# Patient Record
Sex: Female | Born: 1975 | Race: White | Hispanic: No | Marital: Married | Smoking: Current every day smoker
Health system: Southern US, Community
[De-identification: ages and names within clinical notes are randomized; demographics above are authoritative.]

## PROBLEM LIST (undated history)

## (undated) DIAGNOSIS — F32A Depression, unspecified: Secondary | ICD-10-CM

## (undated) DIAGNOSIS — F329 Major depressive disorder, single episode, unspecified: Secondary | ICD-10-CM

## (undated) DIAGNOSIS — Z87442 Personal history of urinary calculi: Secondary | ICD-10-CM

## (undated) DIAGNOSIS — K219 Gastro-esophageal reflux disease without esophagitis: Secondary | ICD-10-CM

## (undated) DIAGNOSIS — D259 Leiomyoma of uterus, unspecified: Secondary | ICD-10-CM

## (undated) DIAGNOSIS — N200 Calculus of kidney: Secondary | ICD-10-CM

## (undated) DIAGNOSIS — F419 Anxiety disorder, unspecified: Secondary | ICD-10-CM

## (undated) DIAGNOSIS — E039 Hypothyroidism, unspecified: Secondary | ICD-10-CM

## (undated) HISTORY — DX: Calculus of kidney: N20.0

---

## 2004-10-28 ENCOUNTER — Ambulatory Visit: Payer: Self-pay

## 2004-11-26 ENCOUNTER — Observation Stay: Payer: Self-pay | Admitting: Obstetrics and Gynecology

## 2005-07-28 ENCOUNTER — Ambulatory Visit: Payer: Self-pay

## 2005-08-10 ENCOUNTER — Ambulatory Visit: Payer: Self-pay | Admitting: Urology

## 2006-08-03 ENCOUNTER — Ambulatory Visit: Payer: Self-pay | Admitting: Family Medicine

## 2007-12-02 ENCOUNTER — Emergency Department: Payer: Self-pay | Admitting: Emergency Medicine

## 2007-12-04 ENCOUNTER — Ambulatory Visit: Payer: Self-pay | Admitting: Urology

## 2007-12-10 ENCOUNTER — Ambulatory Visit: Payer: Self-pay | Admitting: Urology

## 2007-12-12 ENCOUNTER — Ambulatory Visit: Payer: Self-pay | Admitting: Urology

## 2007-12-13 ENCOUNTER — Ambulatory Visit: Payer: Self-pay | Admitting: Urology

## 2008-03-24 ENCOUNTER — Ambulatory Visit: Payer: Self-pay | Admitting: Urology

## 2008-10-31 ENCOUNTER — Ambulatory Visit: Payer: Self-pay | Admitting: Urology

## 2009-02-28 IMAGING — CR DG ABDOMEN 1V
1 series · 1 of 1 positions shown · non-contrast
Comparison: none

REASON FOR EXAM: renal calculi-lithotripsy
COMMENTS:

[view not recorded]
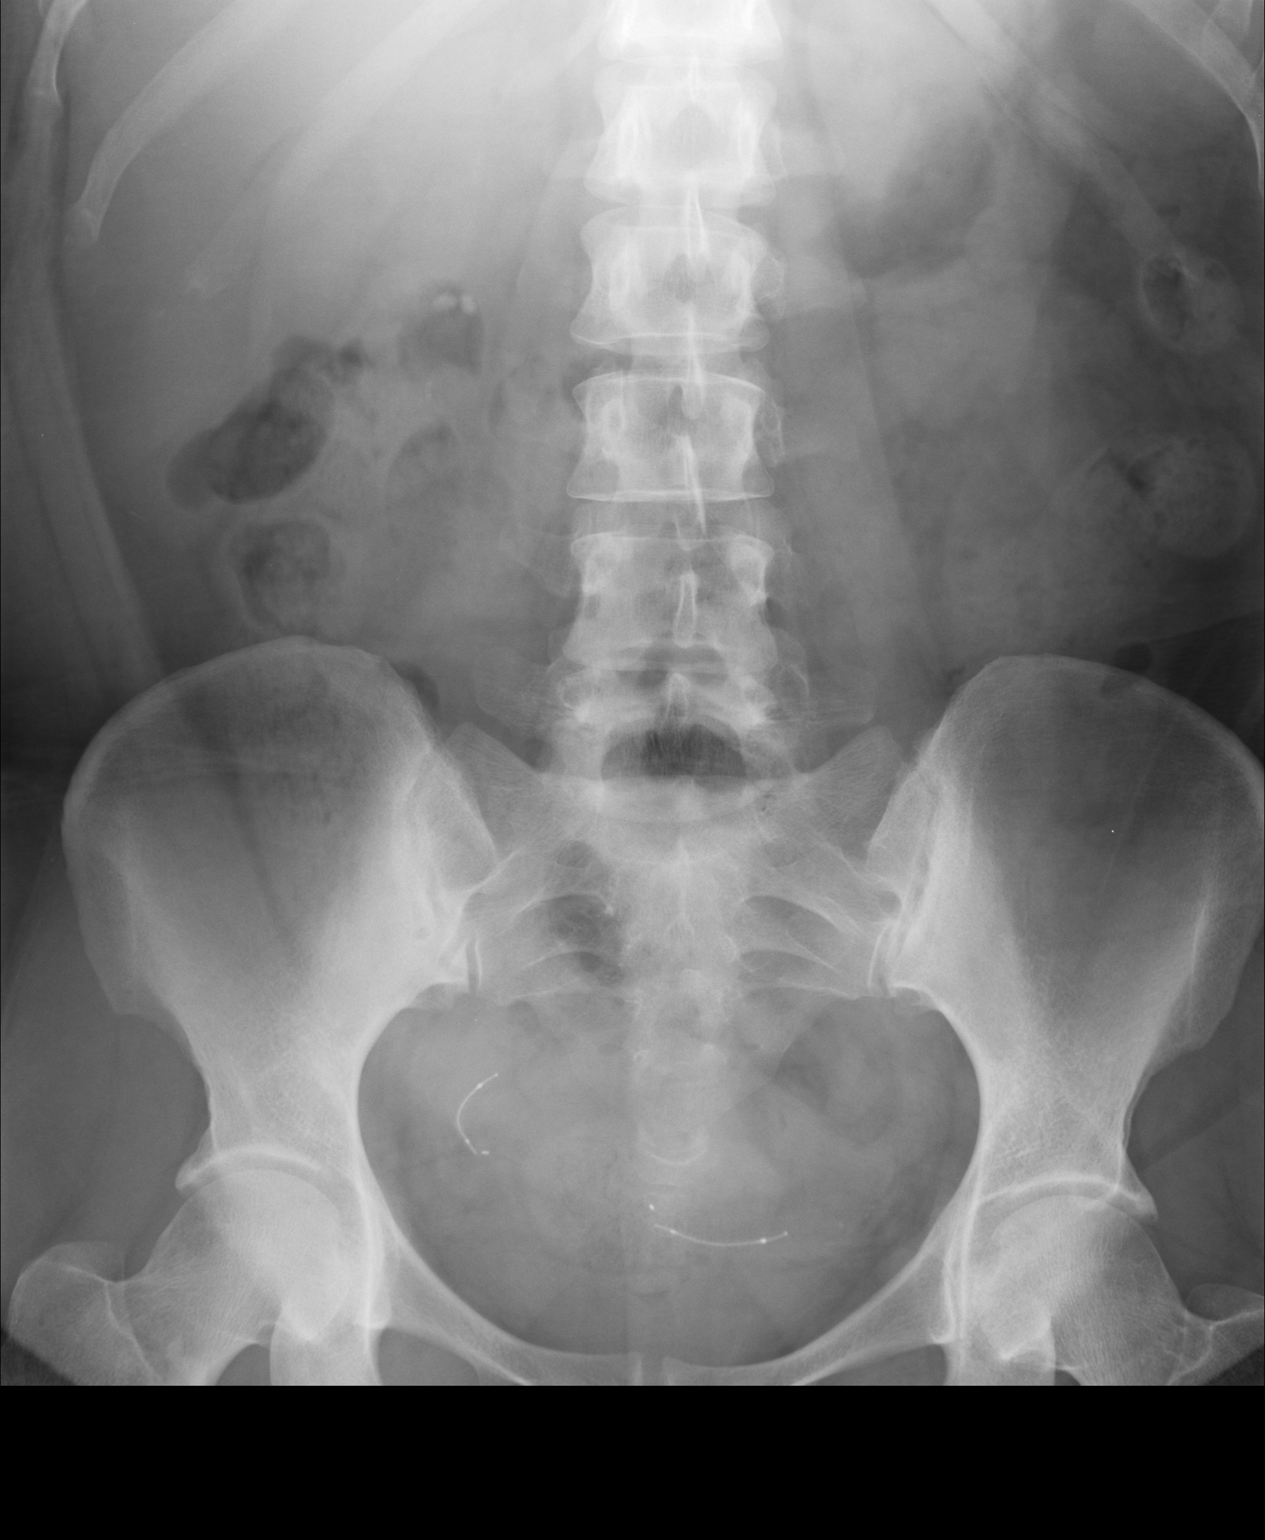

[1 of 1 positions shown; findings below may reference images not displayed]

PROCEDURE:     DXR - DXR KIDNEY URETER BLADDER  - December 13, 2007 [DATE]

RESULT:     Comparison is made to the prior exam of 12/10/2007. There are two
calcifications or calcification fragments projected over the midpole region
of the RIGHT kidney medially and which could be in the renal pelvis. No
additional renal or ureteral calcifications are seen.
IMPRESSION: 1.     RIGHT nephrolithiasis.

## 2009-06-12 ENCOUNTER — Emergency Department: Payer: Self-pay | Admitting: Emergency Medicine

## 2009-12-16 ENCOUNTER — Emergency Department: Payer: Self-pay | Admitting: Emergency Medicine

## 2012-08-14 ENCOUNTER — Observation Stay: Payer: Self-pay | Admitting: Specialist

## 2012-08-14 LAB — BASIC METABOLIC PANEL
Calcium, Total: 9.3 mg/dL (ref 8.5–10.1)
Chloride: 108 mmol/L — ABNORMAL HIGH (ref 98–107)
Creatinine: 0.79 mg/dL (ref 0.60–1.30)
EGFR (African American): >60
Potassium: 3.4 mmol/L — ABNORMAL LOW (ref 3.5–5.1)
Sodium: 140 mmol/L (ref 136–145)

## 2012-08-14 LAB — CBC WITH DIFFERENTIAL/PLATELET
Basophil #: 0 10*3/uL (ref 0.0–0.1)
Basophil %: 0.1 %
Eosinophil #: 0 10*3/uL (ref 0.0–0.7)
HCT: 39.7 % (ref 35.0–47.0)
MCHC: 34.5 g/dL (ref 32.0–36.0)
Monocyte #: 0.4 x10 3/mm (ref 0.2–0.9)
Neutrophil #: 14 10*3/uL — ABNORMAL HIGH (ref 1.4–6.5)
RBC: 4.28 10*6/uL (ref 3.80–5.20)
RDW: 13.7 % (ref 11.5–14.5)
WBC: 15.6 10*3/uL — ABNORMAL HIGH (ref 3.6–11.0)

## 2014-10-17 NOTE — H&P (Signed)
PATIENT NAME:  Jennifer [name], Jennifer [name] MR#:  [id number] DATE OF BIRTH:  [birth year]  DATE OF ADMISSION:  08/14/2012  PRIMARY CARE PHYSICIAN: Delight Stare, MD  REFERRING PHYSICIAN: Conni Slipper, MD  CHIEF COMPLAINT: Lip swelling, hives and raspy voice.  HISTORY OF PRESENT ILLNESS: The patient is a 39 year old Caucasian female with a past medical history of allergic rhinitis, depression and anxiety who is presenting to the ER with the  chief complaint of swollen lips, hives and raspy voice. The patient is reporting that she has been experiencing cold for the past 1 week and has sore throat. Today at around 6:00 she took 1 pill of amoxicillin, which was left over from a previous prescription. The patient started noticing some rash on her upper and lower extremities within 1 hour after taking amoxicillin. She has reported that she was itchy in her scalp and ears and eventually she started having itching sensation all over her body associated with swollen lips. She started having raspy voice regarding which she came into the ER. Interestingly, the patient is reporting that she took amoxicillin several times in the past and she never had allergic reaction. In the ER, the patient has received EpiPen followed by IV Benadryl, Solu-Medrol in the IV form and Ranitidine in the IV form as well. She also received a nebulizer treatment. Rapid strep throat test was ordered with initial lab that was ordered, which is pending. ER physician has asked hospitalist team to admit the patient as the patient still has raspy voice and also complaining of slight shortness of breath. During my examination, her hives and rash are completely resolved, but still the patient is complaining of a scratchy throat and raspy voice. Also she thinks that she might be having some shortness of breath. The lip swelling is significantly improved. No similar complaints in the past. She never had any anaphylactic reaction, never got intubated for allergic  reactions in the past.   PAST MEDICAL HISTORY: Allergic rhinitis, depression and anxiety.   PAST SURGICAL HISTORY: None.  DRUG ALLERGIES: Questionable allergy to AMOXICILLIN and will add LAMICTAL to the allergy list.   HOME MEDICATIONS:  1. Zoloft 100 mg once daily. 2. Lamictal extended release 300 mg once daily.   PSYCHOSOCIAL HISTORY: Lives at home with a boyfriend and children. She smokes more than 1 pack a day. Denies alcohol or drugs.   FAMILY HISTORY: The patient has stated that both her parents are healthy. No significant family history.   REVIEW OF SYSTEMS: CONSTITUTIONAL: Denies fever, fatigue and weakness.  EYES: No blurry vision or cataracts.  ENT: Complaining of sore throat and stuffy nose. Denies any ear pain.  RESPIRATORY: No cough. Denies any wheezing. Denies any painful respirations.  CARDIOVASCULAR: No chest pain, palpitations or syncope.  GASTROINTESTINAL: Denies nausea, vomiting, diarrhea or abdominal pain.  GENITOURINARY: No dysuria or hematuria. GYN AND BREAST:  No breast mass or vaginal discharge.  ENDOCRINE: Denies polyuria, nocturia or thyroid problems.  HEMATOLOGIC AND LYMPHATIC: Denies any anemia, easy bruising or bleeding.  INTEGUMENTARY: No acne, rash or lesions.  MUSCULOSKELETAL: No joint pains. Denies any gout. No arthritis.  NEUROLOGIC: Denies vertigo or ataxia.  PSYCHIATRIC: Has chronic history of anxiety and depression.   PHYSICAL EXAMINATION: VITAL SIGNS: Temperature 97.6, pulse 69, respirations 22, blood pressure 122/41 and pulse ox 96% on room air.  GENERAL APPEARANCE: Not in acute distress, moderately built and moderately nourished.  HEENT: Normocephalic, atraumatic. Pupils are equally reacting to light and accommodation. Positive nasal discharge. No sinus  tenderness. No postnasal drip. Erythematous uvula but uvula is in midline. No laryngeal or pharyngeal edema is noticed.  NECK: Trachea is midline. No JVD.  LUNGS: Clear to auscultation  bilaterally. No accessory muscle usage. No anterior chest wall tenderness on palpation.  CARDIAC: S1 and S2 normal. Regular rate and rhythm.  ABDOMEN: Soft. Bowel sounds are positive in all 4 quadrants. Nontender, nondistended. No masses felt.  NEUROLOGIC: Awake, alert and oriented x 3. Answering questions appropriately. Motor and sensory are grossly intact. Cranial nerves II through XII are grossly intact.  EXTREMITIES: No edema. No cyanosis. No clubbing.  SKIN: Warm to touch. Normal turgor. No lesions noticed.  MUSCULOSKELETAL: No joint effusion. No tenderness. No erythema.   LAB STUDIES: CBC and BMP were ordered, which are still pending. Rapid strep test is still pending at this time.   ASSESSMENT AND PLAN: A 39 year old Caucasian female presenting to the ER with the chief complaint of rash, swollen lips and raspy voice and will be admitted with the following assessment and plan.  1. Angioedema, most probably from Lamictal which could cause severe angioedema. Plan is to discontinue Lamictal. EpiPen was given x 1. We give her EpiPen as needed basis. We will observe her overnight and will continue Benadryl as needed basis. Solu-Medrol 40 mg IV q. 12 hours. Ranitidine will be provided at 150 mg twice a day. Psych consult is placed regarding the possibility of Lamictal causing angioedema. We will add Lamictal to the allergy list.  2. Acute pharyngitis, viral versus bacterial. Rapid strep test is pending at this time. We will order throat culture and sensitivity.  3. Depression. Continue Zoloft. Currently the patient is stable. Denies any suicidal or homicidal ideation.  4. Anxiety. Continue current medication, Zoloft.  5. Gastrointestinal prophylaxis with Ranitidine. Deep venous thrombosis prophylaxis is not needed as the patient does not have risk factor and she is ambulatory.  6. FULL CODE.  The diagnosis and plan of care was discussed in detail with the patient. The patient is aware of the plan.  Nicotine cessation counseling was provided and will provide her nicotine patch.  TOTAL TIME SPENT ON ADMISSION: 45 minutes.  ____________________________ Nicholes Mango, MD ag:sb D: 08/14/2012 00:15:58 ET    T: 08/14/2012 08:37:26 ET       JOB#: [id number] cc: Nicholes Mango, MD, <Dictator> Nicholes Mango MD ELECTRONICALLY SIGNED 08/15/2012 5:38

## 2014-10-17 NOTE — Discharge Summary (Signed)
PATIENT NAME:  Jennifer [name] MR#:  [id number] DATE OF BIRTH:  [birth year]  DATE OF ADMISSION:  08/14/2012 DATE OF DISCHARGE:  08/14/2012  For a detailed note, please see the history and physical done on admission by Dr. Margaretmary Eddy.   DIAGNOSES AT DISCHARGE:   1.  Angioedema likely related to amoxicillin.  2.  History of depression.   DIET:  The patient is being discharged on a regular diet.   ACTIVITY:  As tolerated.   FOLLOWUP:  With Dr. Delight Stare in the next 1 to 2 weeks.   DISCHARGE MEDICATIONS:  Zoloft 100 mg daily, Lamictal 150 mg b.i.d., epinephrine pen as needed and prednisone taper starting at 50 mg down 10 mg over the next 5 days.   HOSPITAL COURSE:  This is a 39 year old female with medical problems as mentioned above who presented to the hospital on February 18th secondary to lip swelling, hives and a raspy voice.  1.  Allergic reaction/angioedema. The patient presented to the hospital with a rash, a raspy voice and some lip swelling. Initially, it was thought that the reaction was possibly due to Lamictal as one of the serious side effects of Lamictal is angioedema. Although upon further questioning, the patient had been on the same dose of Lamictal and has been on it for about 4 to 5 years and has tolerated it well. She did stay she had been suffering from a sore throat and possible upper respiratory infection and therefore, she took a pill of amoxicillin yesterday and 45 minutes after she took the amoxicillin, she developed these symptoms. Therefore, the likely culprit is probably amoxicillin and not likely the Lamictal. The patient after getting some IV steroids and antihistamines has significantly improved. She shows no signs of any anaphylaxis, no lip swelling, no wheezing, no bronchospasm and is presently hemodynamically stable. I am therefore discharging her on a prednisone taper along with an epinephrine pen as needed. She is also told to continue her Lamictal and follow up with  her psychiatrist as an outpatient.  2.  History of depression. The patient will continue her Lamictal and Zoloft as stated. As mentioned initially, there was some concern that she had an allergic reaction to Lamictal, but she has been on it for many years, unlikely to be the source of her angioedema. I did discuss with her that she needs to discuss her plan of care with her psychiatrist as an outpatient if an alternative medication needs to be started.   CODE STATUS:  The patient is a full code.   TIME SPENT:  35 minutes.    ____________________________ Belia Heman. Verdell Carmine, MD vjs:si D: 08/14/2012 16:24:50 ET T: 08/14/2012 17:35:38 ET JOB#: [id number]  cc: Belia Heman. Verdell Carmine, MD, <Dictator> Marguerita Merles, MD Henreitta Leber MD ELECTRONICALLY SIGNED 08/24/2012 1:00

## 2015-02-21 ENCOUNTER — Encounter: Payer: Self-pay | Admitting: Medical Oncology

## 2015-02-21 ENCOUNTER — Emergency Department: Payer: Self-pay

## 2015-02-21 ENCOUNTER — Emergency Department
Admission: EM | Admit: 2015-02-21 | Discharge: 2015-02-21 | Disposition: A | Discharge status: Home / Self Care | Payer: Self-pay | Attending: Emergency Medicine | Admitting: Emergency Medicine

## 2015-02-21 DIAGNOSIS — D259 Leiomyoma of uterus, unspecified: Secondary | ICD-10-CM | POA: Insufficient documentation

## 2015-02-21 DIAGNOSIS — Z880 Allergy status to penicillin: Secondary | ICD-10-CM | POA: Insufficient documentation

## 2015-02-21 DIAGNOSIS — R102 Pelvic and perineal pain: Secondary | ICD-10-CM

## 2015-02-21 DIAGNOSIS — Z3202 Encounter for pregnancy test, result negative: Secondary | ICD-10-CM | POA: Insufficient documentation

## 2015-02-21 DIAGNOSIS — Z720 Tobacco use: Secondary | ICD-10-CM | POA: Insufficient documentation

## 2015-02-21 HISTORY — DX: Depression, unspecified: F32.A

## 2015-02-21 HISTORY — DX: Leiomyoma of uterus, unspecified: D25.9

## 2015-02-21 HISTORY — DX: Major depressive disorder, single episode, unspecified: F32.9

## 2015-02-21 LAB — URINALYSIS COMPLETE WITH MICROSCOPIC (ARMC ONLY)
BACTERIA UA: NONE SEEN
Bilirubin Urine: NEGATIVE
Glucose, UA: NEGATIVE mg/dL
HGB URINE DIPSTICK: NEGATIVE
Ketones, ur: NEGATIVE mg/dL
NITRITE: NEGATIVE
PH: 5 (ref 5.0–8.0)
Protein, ur: NEGATIVE mg/dL
SPECIFIC GRAVITY, URINE: 1.023 (ref 1.005–1.030)

## 2015-02-21 LAB — WET PREP, GENITAL
Clue Cells Wet Prep HPF POC: NEGATIVE — AB
Trich, Wet Prep: NEGATIVE — AB
YEAST WET PREP: NEGATIVE — AB

## 2015-02-21 LAB — COMPREHENSIVE METABOLIC PANEL
ALK PHOS: 53 U/L (ref 38–126)
ALT: 30 U/L (ref 14–54)
AST: 28 U/L (ref 15–41)
Albumin: 4.4 g/dL (ref 3.5–5.0)
Anion gap: 9 (ref 5–15)
BUN: 16 mg/dL (ref 6–20)
CALCIUM: 9.4 mg/dL (ref 8.9–10.3)
CO2: 27 mmol/L (ref 22–32)
CREATININE: 0.9 mg/dL (ref 0.44–1.00)
Chloride: 103 mmol/L (ref 101–111)
GFR calc Af Amer: >60 mL/min (ref >60)
GFR calc non Af Amer: >60 mL/min (ref >60)
Glucose, Bld: 93 mg/dL (ref 65–99)
Potassium: 3.9 mmol/L (ref 3.5–5.1)
Sodium: 139 mmol/L (ref 135–145)
TOTAL PROTEIN: 7.4 g/dL (ref 6.5–8.1)
Total Bilirubin: 0.5 mg/dL (ref 0.3–1.2)

## 2015-02-21 LAB — LIPASE, BLOOD: Lipase: 22 U/L (ref 22–51)

## 2015-02-21 LAB — CHLAMYDIA/NGC RT PCR (ARMC ONLY)
Chlamydia Tr: NOT DETECTED
N GONORRHOEAE: NOT DETECTED

## 2015-02-21 LAB — CBC
HEMATOCRIT: 43.7 % (ref 35.0–47.0)
HEMOGLOBIN: 14.6 g/dL (ref 12.0–16.0)
MCH: 31.5 pg (ref 26.0–34.0)
MCHC: 33.5 g/dL (ref 32.0–36.0)
MCV: 94.1 fL (ref 80.0–100.0)
Platelets: 268 10*3/uL (ref 150–440)
RBC: 4.65 MIL/uL (ref 3.80–5.20)
RDW: 13.6 % (ref 11.5–14.5)
WBC: 7.4 10*3/uL (ref 3.6–11.0)

## 2015-02-21 LAB — POCT PREGNANCY, URINE: PREG TEST UR: NEGATIVE

## 2015-02-21 MED ORDER — OXYCODONE-ACETAMINOPHEN 5-325 MG PO TABS
ORAL_TABLET | ORAL | Status: AC
  Filled 2015-02-21: qty 1

## 2015-02-21 MED ORDER — OXYCODONE-ACETAMINOPHEN 5-325 MG PO TABS
2.0000 | ORAL_TABLET | Freq: Once | ORAL | Status: AC
  Administered 2015-02-21: 2 via ORAL
  Filled 2015-02-21: qty 2

## 2015-02-21 NOTE — Discharge Instructions (Signed)
Fibroids Fibroids are lumps (tumors) that can occur any place in a woman's body. These lumps are not cancerous. Fibroids vary in size, weight, and where they grow. HOME CARE  Do not take aspirin.  Write down the number of pads or tampons you use during your period. Tell your doctor. This can help determine the best treatment for you. GET HELP RIGHT AWAY IF:  You have pain in your lower belly (abdomen) that is not helped with medicine.  You have cramps that are not helped with medicine.  You have more bleeding between or during your period.  You feel lightheaded or pass out (faint).  Your lower belly pain gets worse. MAKE SURE YOU:  Understand these instructions.  Will watch your condition.  Will get help right away if you are not doing well or get worse. Document Released: 07/16/2010 Document Revised: 09/05/2011 Document Reviewed: 07/16/2010 [facility] Patient Information 2015 [place], [place]. This information is not intended to replace advice given to you by your health care provider. Make sure you discuss any questions you have with your health care provider. Pelvic Pain Female pelvic pain can be caused by many different things and start from a variety of places. Pelvic pain refers to pain that is located in the lower half of the abdomen and between your hips. The pain may occur over a short period of time (acute) or may be reoccurring (chronic). The cause of pelvic pain may be related to disorders affecting the female reproductive organs (gynecologic), but it may also be related to the bladder, kidney stones, an intestinal complication, or muscle or skeletal problems. Getting help right away for pelvic pain is important, especially if there has been severe, sharp, or a sudden onset of unusual pain. It is also important to get help right away because some types of pelvic pain can be life threatening.  CAUSES  Below are only some of the causes of pelvic pain. The causes of pelvic pain  can be in one of several categories.   Gynecologic.  Pelvic inflammatory disease.  Sexually transmitted infection.  Ovarian cyst or a twisted ovarian ligament (ovarian torsion).  Uterine lining that grows outside the uterus (endometriosis).  Fibroids, cysts, or tumors.  Ovulation.  Pregnancy.  Pregnancy that occurs outside the uterus (ectopic pregnancy).  Miscarriage.  Labor.  Abruption of the placenta or ruptured uterus.  Infection.  Uterine infection (endometritis).  Bladder infection.  Diverticulitis.  Miscarriage related to a uterine infection (septic abortion).  Bladder.  Inflammation of the bladder (cystitis).  Kidney stone(s).  Gastrointestinal.  Constipation.  Diverticulitis.  Neurologic.  Trauma.  Feeling pelvic pain because of mental or emotional causes (psychosomatic).  Cancers of the bowel or pelvis. EVALUATION  Your caregiver will want to take a careful history of your concerns. This includes recent changes in your health, a careful gynecologic history of your periods (menses), and a sexual history. Obtaining your family history and medical history is also important. Your caregiver may suggest a pelvic exam. A pelvic exam will help identify the location and severity of the pain. It also helps in the evaluation of which organ system may be involved. In order to identify the cause of the pelvic pain and be properly treated, your caregiver may order tests. These tests may include:   A pregnancy test.  Pelvic ultrasonography.  An X-ray exam of the abdomen.  A urinalysis or evaluation of vaginal discharge.  Blood tests. HOME CARE INSTRUCTIONS   Only take over-the-counter or prescription medicines for  pain, discomfort, or fever as directed by your caregiver.   Rest as directed by your caregiver.   Eat a balanced diet.   Drink enough fluids to make your urine clear or pale yellow, or as directed.   Avoid sexual intercourse if it  causes pain.   Apply warm or cold compresses to the lower abdomen depending on which one helps the pain.   Avoid stressful situations.   Keep a journal of your pelvic pain. Write down when it started, where the pain is located, and if there are things that seem to be associated with the pain, such as food or your menstrual cycle.  Follow up with your caregiver as directed.  SEEK MEDICAL CARE IF:  Your medicine does not help your pain.  You have abnormal vaginal discharge. SEEK IMMEDIATE MEDICAL CARE IF:   You have heavy bleeding from the vagina.   Your pelvic pain increases.   You feel light-headed or faint.   You have chills.   You have pain with urination or blood in your urine.   You have uncontrolled diarrhea or vomiting.   You have a fever or persistent symptoms for more than 3 days.  You have a fever and your symptoms suddenly get worse.   You are being physically or sexually abused.  MAKE SURE YOU:  Understand these instructions.  Will watch your condition.  Will get help if you are not doing well or get worse. Document Released: 05/10/2004 Document Revised: 10/28/2013 Document Reviewed: 10/03/2011 [facility] Patient Information 2015 [place], [place]. This information is not intended to replace advice given to you by your health care provider. Make sure you discuss any questions you have with your health care provider.

## 2015-02-21 NOTE — ED Notes (Signed)
Patient transported to Ultrasound 

## 2015-02-21 NOTE — ED Provider Notes (Signed)
[facility] Emergency Department Provider Note     Time seen: ----------------------------------------- 7:26 PM on 02/21/2015 -----------------------------------------    I have reviewed the triage vital signs and the nursing notes.   HISTORY  Chief Complaint Abdominal Pain    HPI Jennifer [name] is a 39 y.o. female who presents ER with sharp lower abdominal pain. States she's had a history of this before, and it hurts intermittently. Describes sharp stabbing pain that she thinks is from a uterine fibroid. Also has some pain when she urinates. States she began having pain last night and it feels similar to previous pain. She does have vaginal bleeding or discharge. She notes she started having menstrual cycle this month.   Past Medical History  Diagnosis Date  . Depression   . Uterine fibroid     There are no active problems to display for this patient.   History reviewed. No pertinent past surgical history.  Allergies Amoxicillin; Bactrim; and Hydrocodone  Social History Social History  Substance Use Topics  . Smoking status: Current Every Day Smoker -- 0.50 packs/day    Types: Cigarettes  . Smokeless tobacco: None  . Alcohol Use: No    Review of Systems Constitutional: Negative for fever. Eyes: Negative for visual changes. ENT: Negative for sore throat. Cardiovascular: Negative for chest pain. Respiratory: Negative for shortness of breath. Gastrointestinal: Positive for lower abdominal pain, negative for vomiting and diarrhea. Genitourinary: Positive for dysuria and pelvic pain, positive for vaginal bleeding Musculoskeletal: Negative for back pain. Skin: Negative for rash. Neurological: Negative for headaches, focal weakness or numbness.  10-point ROS otherwise negative.  ____________________________________________   PHYSICAL EXAM:  VITAL SIGNS: ED Triage Vitals  Enc Vitals Group     BP 02/21/15 1900 120/61 mmHg      Pulse Rate 02/21/15 1900 81     Resp 02/21/15 1900 18     Temp 02/21/15 1900 98.3 F (36.8 C)     Temp Source 02/21/15 1900 Oral     SpO2 02/21/15 1900 97 %     Weight 02/21/15 1900 201 lb (91.173 kg)     Height 02/21/15 1900 5\' 4"  (1.626 m)     Head Cir --      Peak Flow --      Pain Score 02/21/15 1901 8     Pain Loc --      Pain Edu? --      Excl. in [place]? --     Constitutional: Alert and oriented. Well appearing and in no distress. Eyes: Conjunctivae are normal. PERRL. Normal extraocular movements. ENT   Head: Normocephalic and atraumatic.   Nose: No congestion/rhinnorhea.   Mouth/Throat: Mucous membranes are moist.   Neck: No stridor. Cardiovascular: Normal rate, regular rhythm. Normal and symmetric distal pulses are present in all extremities. No murmurs, rubs, or gallops. Respiratory: Normal respiratory effort without tachypnea nor retractions. Breath sounds are clear and equal bilaterally. No wheezes/rales/rhonchi. Gastrointestinal: Soft and nontender. No distention. No abdominal bruits.  Pelvic: Palpable fibroid tumors present, thin vaginal discharge, mild cervical motion tenderness Musculoskeletal: Nontender with normal range of motion in all extremities. No joint effusions.  No lower extremity tenderness nor edema. Neurologic:  Normal speech and language. No gross focal neurologic deficits are appreciated. Speech is normal. No gait instability. Skin:  Skin is warm, dry and intact. No rash noted. Psychiatric: Mood and affect are normal. Speech and behavior are normal. Patient exhibits appropriate insight and judgment. ____________________________________________  ED COURSE:  Pertinent labs &  imaging results that were available during my care of the patient were reviewed by me and considered in my medical decision making (see chart for details). Patient with pelvic pain, will need a pelvic examination basic labs and  urine ____________________________________________    LABS (pertinent positives/negatives)  Labs Reviewed  WET PREP, GENITAL - Abnormal; Notable for the following:    Yeast Wet Prep HPF POC NEGATIVE (*)    Trich, Wet Prep NEGATIVE (*)    Clue Cells Wet Prep HPF POC NEGATIVE (*)    WBC, Wet Prep HPF POC FEW (*)    All other components within normal limits  URINALYSIS COMPLETEWITH MICROSCOPIC ([facility] ONLY) - Abnormal; Notable for the following:    Color, Urine YELLOW (*)    APPearance CLEAR (*)    Leukocytes, UA TRACE (*)    Squamous Epithelial / LPF 0-5 (*)    All other components within normal limits  CHLAMYDIA/NGC RT PCR ([facility] ONLY)  LIPASE, BLOOD  COMPREHENSIVE METABOLIC PANEL  CBC  POCT PREGNANCY, URINE   ____________________________________________  FINAL ASSESSMENT AND PLAN  Pelvic pain, fibroid tumor  Plan: Patient with labs and imaging as dictated above. She is in no acute distress, final disposition will be checked out to Dr. Karma Greaser.   Earleen Newport, MD   Earleen Newport, MD 02/21/15 [phone]

## 2015-02-21 NOTE — ED Provider Notes (Signed)
----------------------------------------- 10:01 PM on 02/21/2015 -----------------------------------------   Blood pressure 106/53, pulse 65, temperature 97.8 F (36.6 C), temperature source Oral, resp. rate 20, height 5\' 4"  (1.626 m), weight 201 lb (91.173 kg), last menstrual period 02/07/2015, SpO2 95 %.  Assuming care from Dr. Jimmye Norman.  In short, Jennifer [name] is a 39 y.o. female with a chief complaint of Abdominal Pain .  Refer to the original H&P for additional details.  The current plan of care is to follow-up on the ultrasound results.  Jimmye Norman anticipates discharge for outpatient follow-up  ----------------------------------------- 11:02 PM on 02/21/2015 -----------------------------------------  The ultrasound showed a fibroid as expected.  The patient has normal vital signs is awake and alert with no issues at this time.  I gave her the results and our usual and customary return precautions.  She will follow up with [facility].  US Transvaginal Non-ob  02/21/2015   CLINICAL DATA:  Lower pelvic pain more towards the right since early this morning. History of fibroids and tubal ligation using coils.  EXAM: TRANSABDOMINAL AND TRANSVAGINAL ULTRASOUND OF PELVIS  TECHNIQUE: Both transabdominal and transvaginal ultrasound examinations of the pelvis were performed. Transabdominal technique was performed for global imaging of the pelvis including uterus, ovaries, adnexal regions, and pelvic cul-de-sac. It was necessary to proceed with endovaginal exam following the transabdominal exam to visualize the endometrium and ovaries.  COMPARISON:  CT abdomen and pelvis 06/12/2009  FINDINGS: Uterus  Measurements: 11 x 5 x 5.9 cm. Uterus is anteverted. There is a heterogeneous exophytic mass arising from posterior aspect of the uterus and measuring about 8 x 7 by day 0.8 cm. This is likely represent large uterine fibroid. Small nabothian cysts in the cervix.  Endometrium  Thickness: 8.4 mm.  No  focal abnormality visualized.  Right ovary  Measurements: 3.5 x 1.8 x 3.8 cm. Limited visualization. Seen only on transabdominal images. No abnormal adnexal masses identified.  Left ovary  Measurements: 3.4 x 1.4 x 2.1 cm. Limited visualization. Seen only on transabdominal images. No abnormal adnexal masses demonstrated.  Other findings  No free fluid.  IMPRESSION: Large mass apparently arising from the posterior aspect of the uterus consistent with uterine fibroid. Limited visualization of the ovaries, seen only on transabdominal imaging but no adnexal masses are identified.   Electronically Signed   By: Lucienne Capers M.D.   On: 02/21/2015 22:52   US Pelvis Complete  02/21/2015   CLINICAL DATA:  Lower pelvic pain more towards the right since early this morning. History of fibroids and tubal ligation using coils.  EXAM: TRANSABDOMINAL AND TRANSVAGINAL ULTRASOUND OF PELVIS  TECHNIQUE: Both transabdominal and transvaginal ultrasound examinations of the pelvis were performed. Transabdominal technique was performed for global imaging of the pelvis including uterus, ovaries, adnexal regions, and pelvic cul-de-sac. It was necessary to proceed with endovaginal exam following the transabdominal exam to visualize the endometrium and ovaries.  COMPARISON:  CT abdomen and pelvis 06/12/2009  FINDINGS: Uterus  Measurements: 11 x 5 x 5.9 cm. Uterus is anteverted. There is a heterogeneous exophytic mass arising from posterior aspect of the uterus and measuring about 8 x 7 by day 0.8 cm. This is likely represent large uterine fibroid. Small nabothian cysts in the cervix.  Endometrium  Thickness: 8.4 mm.  No focal abnormality visualized.  Right ovary  Measurements: 3.5 x 1.8 x 3.8 cm. Limited visualization. Seen only on transabdominal images. No abnormal adnexal masses identified.  Left ovary  Measurements: 3.4 x 1.4 x 2.1 cm. Limited visualization.  Seen only on transabdominal images. No abnormal adnexal masses demonstrated.   Other findings  No free fluid.  IMPRESSION: Large mass apparently arising from the posterior aspect of the uterus consistent with uterine fibroid. Limited visualization of the ovaries, seen only on transabdominal imaging but no adnexal masses are identified.   Electronically Signed   By: Lucienne Capers M.D.   On: 02/21/2015 22:52      Hinda Kehr, MD 02/21/15 2302

## 2015-02-21 NOTE — ED Notes (Signed)
Pt. Going home with family. 

## 2015-02-21 NOTE — ED Notes (Signed)
Pt to triage with reports that she was recently seen at [facility] and diagnosed with uterine fibroid, pt reports she was placed on abx for an infection. Pt reports she began having lower abd pain last night, pain feels similar to last time. Pt also reports urinary sx's.

## 2015-05-14 ENCOUNTER — Encounter: Payer: Self-pay | Admitting: Emergency Medicine

## 2015-05-14 ENCOUNTER — Emergency Department
Admission: EM | Admit: 2015-05-14 | Discharge: 2015-05-14 | Disposition: A | Discharge status: Home / Self Care | Payer: Self-pay | Attending: Emergency Medicine | Admitting: Emergency Medicine

## 2015-05-14 DIAGNOSIS — Z880 Allergy status to penicillin: Secondary | ICD-10-CM | POA: Insufficient documentation

## 2015-05-14 DIAGNOSIS — M62830 Muscle spasm of back: Secondary | ICD-10-CM | POA: Insufficient documentation

## 2015-05-14 DIAGNOSIS — F17210 Nicotine dependence, cigarettes, uncomplicated: Secondary | ICD-10-CM | POA: Insufficient documentation

## 2015-05-14 DIAGNOSIS — Z791 Long term (current) use of non-steroidal anti-inflammatories (NSAID): Secondary | ICD-10-CM | POA: Insufficient documentation

## 2015-05-14 DIAGNOSIS — Z79899 Other long term (current) drug therapy: Secondary | ICD-10-CM | POA: Insufficient documentation

## 2015-05-14 LAB — URINALYSIS COMPLETE WITH MICROSCOPIC (ARMC ONLY)
BILIRUBIN URINE: NEGATIVE
Glucose, UA: NEGATIVE mg/dL
Hgb urine dipstick: NEGATIVE
KETONES UR: NEGATIVE mg/dL
Leukocytes, UA: NEGATIVE
NITRITE: NEGATIVE
PROTEIN: NEGATIVE mg/dL
SPECIFIC GRAVITY, URINE: 1.021 (ref 1.005–1.030)
pH: 7 (ref 5.0–8.0)

## 2015-05-14 MED ORDER — NAPROXEN 500 MG PO TABS: 500.0000 mg | ORAL_TABLET | Freq: Two times a day (BID) | ORAL | Status: DC

## 2015-05-14 MED ORDER — KETOROLAC TROMETHAMINE 30 MG/ML IJ SOLN
30.0000 mg | Freq: Once | INTRAMUSCULAR | Status: AC
  Administered 2015-05-14: 30 mg via INTRAMUSCULAR
  Filled 2015-05-14: qty 1

## 2015-05-14 MED ORDER — DIAZEPAM 5 MG PO TABS: 5.0000 mg | ORAL_TABLET | Freq: Three times a day (TID) | ORAL | Status: AC | PRN

## 2015-05-14 NOTE — Discharge Instructions (Signed)

## 2015-05-14 NOTE — ED Provider Notes (Signed)
[facility] Emergency Department Provider Note  ____________________________________________  Time seen: On arrival  I have reviewed the triage vital signs and the nursing notes.   HISTORY  Chief Complaint Back Pain    HPI Jennifer [name] is a 39 y.o. female who presents with complaints of back pain for approximately 2 days. She denies injury or trauma. She denies dysuria. She has a history of kidney stones but this is in the central back. No fevers no chills. No numbness or tingling or weakness in her lower extremity. No loss of bowel or bladder incontinence.    Past Medical History  Diagnosis Date  . Depression   . Uterine fibroid     There are no active problems to display for this patient.   History reviewed. No pertinent past surgical history.  Current Outpatient Rx  Name  Route  Sig  Dispense  Refill  . albuterol (PROVENTIL HFA;VENTOLIN HFA) 108 (90 BASE) MCG/ACT inhaler   Inhalation   Inhale 2 puffs into the lungs every 6 (six) hours as needed for wheezing or shortness of breath.         . diazepam (VALIUM) 5 MG tablet   Oral   Take 1 tablet (5 mg total) by mouth every 8 (eight) hours as needed for muscle spasms.   20 tablet   0   . ibuprofen (ADVIL,MOTRIN) 200 MG tablet   Oral   Take 800 mg by mouth 2 (two) times daily.         . naproxen (NAPROSYN) 500 MG tablet   Oral   Take 1 tablet (500 mg total) by mouth 2 (two) times daily with a meal.   20 tablet   2   . sertraline (ZOLOFT) 100 MG tablet   Oral   Take 200 mg by mouth every evening.           Allergies Amoxicillin; Bactrim; and Hydrocodone  History reviewed. No pertinent family history.  Social History Social History  Substance Use Topics  . Smoking status: Current Every Day Smoker -- 0.50 packs/day    Types: Cigarettes  . Smokeless tobacco: None  . Alcohol Use: No    Review of Systems  Constitutional: Negative for fever. Eyes: Negative for visual  changes. ENT: Negative for sore throat   Genitourinary: Negative for dysuria. Musculoskeletal: Negative for back pain. Skin: Negative for rash. Neurological: Negative for headaches or focal weakness   ____________________________________________   PHYSICAL EXAM:  VITAL SIGNS: ED Triage Vitals  Enc Vitals Group     BP 05/14/15 1332 136/72 mmHg     Pulse Rate 05/14/15 1332 67     Resp 05/14/15 1332 20     Temp 05/14/15 1332 98 F (36.7 C)     Temp Source 05/14/15 1332 Oral     SpO2 05/14/15 1332 98 %     Weight 05/14/15 1332 201 lb (91.173 kg)     Height 05/14/15 1332 5\' 4"  (1.626 m)     Head Cir --      Peak Flow --      Pain Score 05/14/15 1333 9     Pain Loc --      Pain Edu? --      Excl. in [place]? --      Constitutional: Alert and oriented. Well appearing and in no distress. Eyes: Conjunctivae are normal.  ENT   Head: Normocephalic and atraumatic.   Mouth/Throat: Mucous membranes are moist. Cardiovascular: Normal rate, regular rhythm.  Respiratory:  Normal respiratory effort without tachypnea nor retractions.  Gastrointestinal: Soft and non-tender in all quadrants. No distention. There is no CVA tenderness. Musculoskeletal: Nontender with normal range of motion in all extremities. No vertebral tenderness to palpation. Tenderness to palpation in the lumbar paraspinal area, muscle spasm noted. Normal strength in the lower extremity is Neurologic:  Normal speech and language. No gross focal neurologic deficits are appreciated. No focal deficits Skin:  Skin is warm, dry and intact. No rash noted. Psychiatric: Mood and affect are normal. Patient exhibits appropriate insight and judgment.  ____________________________________________    LABS (pertinent positives/negatives)  Labs Reviewed  URINALYSIS COMPLETEWITH MICROSCOPIC ([place] ONLY) - Abnormal; Notable for the following:    Color, Urine YELLOW (*)    APPearance CLEAR (*)    Bacteria, UA FEW (*)     Squamous Epithelial / LPF 0-5 (*)    All other components within normal limits    ____________________________________________     ____________________________________________    RADIOLOGY I have personally reviewed any xrays that were ordered on this patient: None  ____________________________________________   PROCEDURES  Procedure(s) performed: none   ____________________________________________   INITIAL IMPRESSION / ASSESSMENT AND PLAN / ED COURSE  Pertinent labs & imaging results that were available during my care of the patient were reviewed by me and considered in my medical decision making (see chart for details).  Patient's presentation is most consistent with muscle spasm in the lumbar paraspinal area. Naproxen 30 mg IM given. We will discharge with naproxen and Valium. Return precautions given  ____________________________________________   FINAL CLINICAL IMPRESSION(S) / ED DIAGNOSES  Final diagnoses:  Lumbar paraspinal muscle spasm     Lavonia Drafts, MD 05/14/15 1605

## 2015-05-14 NOTE — ED Notes (Signed)
States she developed left flank pain about 3 days ago   Positive nausea or vomiting  States pain radiates occasionally into leg  Ambulates well to treatment area

## 2015-05-14 NOTE — ED Notes (Signed)
Pt to ed with c/o lower back pain x 2 days.  Pt denies recent injury.  Pt reports pain is in left flank area, reports hx of kidney stones.  Denies difficulty urinating.

## 2015-08-06 ENCOUNTER — Emergency Department
Admission: EM | Admit: 2015-08-06 | Discharge: 2015-08-06 | Disposition: A | Discharge status: Home / Self Care | Payer: Self-pay | Attending: Emergency Medicine | Admitting: Emergency Medicine

## 2015-08-06 ENCOUNTER — Encounter: Payer: Self-pay | Admitting: Emergency Medicine

## 2015-08-06 DIAGNOSIS — Z760 Encounter for issue of repeat prescription: Secondary | ICD-10-CM | POA: Insufficient documentation

## 2015-08-06 DIAGNOSIS — Z87891 Personal history of nicotine dependence: Secondary | ICD-10-CM | POA: Insufficient documentation

## 2015-08-06 DIAGNOSIS — Z880 Allergy status to penicillin: Secondary | ICD-10-CM | POA: Insufficient documentation

## 2015-08-06 DIAGNOSIS — Z791 Long term (current) use of non-steroidal anti-inflammatories (NSAID): Secondary | ICD-10-CM | POA: Insufficient documentation

## 2015-08-06 MED ORDER — SERTRALINE HCL 50 MG PO TABS: 50.0000 mg | ORAL_TABLET | Freq: Every day | ORAL | Status: DC

## 2015-08-06 NOTE — Discharge Instructions (Signed)
Please follow-up with [facility] as you have scheduled on the 24th of this month.  Return to the emergency room if you develop any symptoms such as hallucinations, confusion, thoughts of hurting yourself or others, become suicidal, or other new concerns arise.  Medicine Refill at the Emergency Department We have refilled your medicine today, but it is best for you to get refills through your primary health care provider's office. In the future, please plan ahead so you do not need to get refills from the emergency department. If the medicine we refilled was a maintenance medicine, you may have received only enough to get you by until you are able to see your regular health care provider.   This information is not intended to replace advice given to you by your health care provider. Make sure you discuss any questions you have with your health care provider.   Document Released: 09/30/2003 Document Revised: 07/04/2014 Document Reviewed: 09/20/2013 Elsevier Interactive Patient Education [employer].

## 2015-08-06 NOTE — ED Notes (Signed)
NAD noted at time of D/C. Pt given information about medication management in reference to "not having any money to refill her prescriptions" at this time. Pt ambulatory to the lobby at this time with no difficulty.

## 2015-08-06 NOTE — ED Notes (Signed)
States has an appt on 2/28 with [facility] for new psych to get back on her zoloft and alprazolam but is has body aches and depression and states can't wait that long

## 2015-08-06 NOTE — ED Provider Notes (Signed)
[facility] Emergency Department Provider Note  ____________________________________________  Time seen: Approximately 12:19 PM  I have reviewed the triage vital signs and the nursing notes.   HISTORY  Chief Complaint Medication Refill    HPI Jennifer [name] is a 40 y.o. female presents for refill of prescription. Patient provides a prescription bottle for Xanax 2 mg tablets as well as Zoloft 100 mg which she reports she ran out of about a month and a half ago each.  She has no clinic coming up with psychiatry at [facility] on the 24th of this month. Denies homicidal or suicidal ideation. States she continues to bowel with mild depressive symptoms, loss of desire, and low mood.   Patient states she is tolerated both medicines well in the past. No pain or other concerns.  Denies pregnancy.   Past Medical History  Diagnosis Date  . Depression   . Uterine fibroid     There are no active problems to display for this patient.   History reviewed. No pertinent past surgical history.  Current Outpatient Rx  Name  Route  Sig  Dispense  Refill  . albuterol (PROVENTIL HFA;VENTOLIN HFA) 108 (90 BASE) MCG/ACT inhaler   Inhalation   Inhale 2 puffs into the lungs every 6 (six) hours as needed for wheezing or shortness of breath.         . diazepam (VALIUM) 5 MG tablet   Oral   Take 1 tablet (5 mg total) by mouth every 8 (eight) hours as needed for muscle spasms.   20 tablet   0   . ibuprofen (ADVIL,MOTRIN) 200 MG tablet   Oral   Take 800 mg by mouth 2 (two) times daily.         . naproxen (NAPROSYN) 500 MG tablet   Oral   Take 1 tablet (500 mg total) by mouth 2 (two) times daily with a meal.   20 tablet   2   . sertraline (ZOLOFT) 50 MG tablet   Oral   Take 1 tablet (50 mg total) by mouth daily.   30 tablet   0     Allergies Amoxicillin; Bactrim; and Hydrocodone  History reviewed. No pertinent family history.  Social History Social History   Substance Use Topics  . Smoking status: Former Smoker -- 0.50 packs/day    Types: Cigarettes  . Smokeless tobacco: None  . Alcohol Use: No    Review of Systems Constitutional: No fever/chills Eyes: No visual changes. ENT: No sore throat. Cardiovascular: Denies chest pain. Respiratory: Denies shortness of breath. Gastrointestinal: No abdominal pain.  No nausea, no vomiting.  No diarrhea.  No constipation. Genitourinary: Negative for dysuria. Musculoskeletal: Negative for back pain. Skin: Negative for rash. Neurological: Negative for headaches, focal weakness or numbness.  10-point ROS otherwise negative.  ____________________________________________   PHYSICAL EXAM:  VITAL SIGNS: ED Triage Vitals  Enc Vitals Group     BP 08/06/15 1150 155/85 mmHg     Pulse Rate 08/06/15 1150 77     Resp 08/06/15 1150 18     Temp 08/06/15 1150 97.7 F (36.5 C)     Temp Source 08/06/15 1150 Oral     SpO2 08/06/15 1150 100 %     Weight 08/06/15 1150 195 lb (88.451 kg)     Height 08/06/15 1150 5\' 4"  (1.626 m)     Head Cir --      Peak Flow --      Pain Score --  Pain Loc --      Pain Edu? --      Excl. in [place]? --    Constitutional: Alert and oriented. Well appearing and in no acute distress. Eyes: Conjunctivae are normal. PERRL. EOMI. Head: Atraumatic. Nose: No congestion/rhinnorhea. Mouth/Throat: Mucous membranes are moist.  Oropharynx non-erythematous. Neck: No stridor.   Cardiovascular: Normal rate, regular rhythm. Grossly normal heart sounds.  Good peripheral circulation. Respiratory: Normal respiratory effort.  No retractions. Lungs CTAB. Gastrointestinal: Soft and nontender.  Musculoskeletal: No lower extremity tenderness nor edema. Neurologic:  Normal speech and language. No gross focal neurologic deficits are appreciated. No gait instability. Skin:  Skin is warm, dry and intact. No rash noted. Psychiatric: Mood and affect are somewhat depressed. Speech and behavior are  normal.  ____________________________________________   LABS (all labs ordered are listed, but only abnormal results are displayed)  Labs Reviewed - No data to display ____________________________________________  EKG   ____________________________________________  RADIOLOGY   ____________________________________________   PROCEDURES  Procedure(s) performed: None  Critical Care performed: No  ____________________________________________   INITIAL IMPRESSION / ASSESSMENT AND PLAN / ED COURSE  Pertinent labs & imaging results that were available during my care of the patient were reviewed by me and considered in my medical decision making (see chart for details).  Patient requests refill of medications. No overt psychiatric symptoms. She's been out of her Xanax for well over a month, no evidence of withdrawals. ____________________________________________   FINAL CLINICAL IMPRESSION(S) / ED DIAGNOSES  Final diagnoses:  Medication refill      Delman Kitten, MD 08/06/15 [phone]

## 2016-03-23 ENCOUNTER — Emergency Department
Admission: EM | Admit: 2016-03-23 | Discharge: 2016-03-24 | Disposition: A | Discharge status: Home / Self Care | Payer: Self-pay | Attending: Emergency Medicine | Admitting: Emergency Medicine

## 2016-03-23 ENCOUNTER — Encounter: Payer: Self-pay | Admitting: Emergency Medicine

## 2016-03-23 DIAGNOSIS — Z79899 Other long term (current) drug therapy: Secondary | ICD-10-CM | POA: Insufficient documentation

## 2016-03-23 DIAGNOSIS — F32A Depression, unspecified: Secondary | ICD-10-CM

## 2016-03-23 DIAGNOSIS — Z87891 Personal history of nicotine dependence: Secondary | ICD-10-CM | POA: Insufficient documentation

## 2016-03-23 DIAGNOSIS — Z791 Long term (current) use of non-steroidal anti-inflammatories (NSAID): Secondary | ICD-10-CM | POA: Insufficient documentation

## 2016-03-23 DIAGNOSIS — F329 Major depressive disorder, single episode, unspecified: Secondary | ICD-10-CM | POA: Insufficient documentation

## 2016-03-23 LAB — COMPREHENSIVE METABOLIC PANEL
ALBUMIN: 4.1 g/dL (ref 3.5–5.0)
ALT: 18 U/L (ref 14–54)
ANION GAP: 6 (ref 5–15)
AST: 20 U/L (ref 15–41)
Alkaline Phosphatase: 52 U/L (ref 38–126)
BILIRUBIN TOTAL: 0.6 mg/dL (ref 0.3–1.2)
BUN: 19 mg/dL (ref 6–20)
CHLORIDE: 109 mmol/L (ref 101–111)
CO2: 27 mmol/L (ref 22–32)
Calcium: 9.1 mg/dL (ref 8.9–10.3)
Creatinine, Ser: 0.82 mg/dL (ref 0.44–1.00)
GFR calc Af Amer: >60 mL/min (ref >60)
GFR calc non Af Amer: >60 mL/min (ref >60)
GLUCOSE: 68 mg/dL (ref 65–99)
POTASSIUM: 3.7 mmol/L (ref 3.5–5.1)
SODIUM: 142 mmol/L (ref 135–145)
TOTAL PROTEIN: 6.4 g/dL — AB (ref 6.5–8.1)

## 2016-03-23 LAB — CBC
HEMATOCRIT: 41.8 % (ref 35.0–47.0)
Hemoglobin: 14.5 g/dL (ref 12.0–16.0)
MCH: 32.3 pg (ref 26.0–34.0)
MCHC: 34.7 g/dL (ref 32.0–36.0)
MCV: 93 fL (ref 80.0–100.0)
Platelets: 266 10*3/uL (ref 150–440)
RBC: 4.49 MIL/uL (ref 3.80–5.20)
RDW: 13.5 % (ref 11.5–14.5)
WBC: 10.6 10*3/uL (ref 3.6–11.0)

## 2016-03-23 LAB — URINALYSIS COMPLETE WITH MICROSCOPIC (ARMC ONLY)
BILIRUBIN URINE: NEGATIVE
GLUCOSE, UA: NEGATIVE mg/dL
Hgb urine dipstick: NEGATIVE
KETONES UR: NEGATIVE mg/dL
Nitrite: NEGATIVE
Protein, ur: NEGATIVE mg/dL
Specific Gravity, Urine: 1.017 (ref 1.005–1.030)
pH: 6 (ref 5.0–8.0)

## 2016-03-23 LAB — URINE DRUG SCREEN, QUALITATIVE (ARMC ONLY)
AMPHETAMINES, UR SCREEN: NOT DETECTED
BENZODIAZEPINE, UR SCRN: NOT DETECTED
Barbiturates, Ur Screen: NOT DETECTED
Cannabinoid 50 Ng, Ur ~~LOC~~: POSITIVE — AB
Cocaine Metabolite,Ur ~~LOC~~: NOT DETECTED
MDMA (ECSTASY) UR SCREEN: NOT DETECTED
METHADONE SCREEN, URINE: NOT DETECTED
OPIATE, UR SCREEN: NOT DETECTED
Phencyclidine (PCP) Ur S: NOT DETECTED
Tricyclic, Ur Screen: POSITIVE — AB

## 2016-03-23 LAB — ETHANOL: Alcohol, Ethyl (B): <5 mg/dL (ref <5)

## 2016-03-23 LAB — ACETAMINOPHEN LEVEL

## 2016-03-23 LAB — POCT PREGNANCY, URINE: Preg Test, Ur: NEGATIVE

## 2016-03-23 LAB — SALICYLATE LEVEL: Salicylate Lvl: <4 mg/dL (ref 2.8–30.0)

## 2016-03-23 NOTE — ED Triage Notes (Signed)
Patient ambulatory to triage with steady gait, without difficulty or distress noted, brought in by [facility] PD officer for IVC; pt denies SI or HI

## 2016-03-23 NOTE — ED Notes (Addendum)
Pink tshirt, black pants, grey bra, black shoes,socks removed and pt dressed in behav scrubs; belongings placed in labeled bag to be secured on nursing unit; pt to keep her glasses; pt st she went to [place] today, out of lexapro x week and unable to get appointment til Thursday; st went today to try to get a refill and was told they couldn't do it; st she got upset and stated "well if something happens to me before then it will be on ya'll"; pt st she said it because she was upset & angry and had no intentions of hurting self

## 2016-03-24 MED ORDER — ESCITALOPRAM OXALATE 10 MG PO TABS: 10.0000 mg | ORAL_TABLET | Freq: Every day | ORAL | 0 refills | Status: DC

## 2016-03-24 NOTE — ED Provider Notes (Signed)
[facility] Emergency Department Provider Note    First MD Initiated Contact with Patient 03/23/16 2345     (approximate)  I have reviewed the triage vital signs and the nursing notes.   HISTORY  Chief Complaint Mental Health Problem   HPI Jennifer [name] is a 40 y.o. female with history of depression presents to the emergency [place] committed secondary to suicidal ideation and paranoia noncompliance with her medication and "seeks to overdose on pills" per practitioner. Patient denies any suicidal ideation. Patient states that she went to [place] yesterday to receive a refill of her Lexapro however she was told that she had to return on Thursday to be evaluated by the psychiatrist before the prescription would be given. Patient states that she became irate after this was told to her and stated that "she wished she was not here any more". Patient stated that she made the statement out of anger due to frustration of not receiving her medications. Patient denies any thoughts of hurting herself or anyone else at this time. Patient states "I just wanted my refill of Lexapro because I been out of it for 1 week".   Past Medical History:  Diagnosis Date  . Depression   . Uterine fibroid     There are no active problems to display for this patient.   History reviewed. No pertinent surgical history.  Prior to Admission medications   Medication Sig Start Date End Date Taking? Authorizing Provider  albuterol (PROVENTIL HFA;VENTOLIN HFA) 108 (90 BASE) MCG/ACT inhaler Inhale 2 puffs into the lungs every 6 (six) hours as needed for wheezing or shortness of breath.    Historical Provider, MD  diazepam (VALIUM) 5 MG tablet Take 1 tablet (5 mg total) by mouth every 8 (eight) hours as needed for muscle spasms. 05/14/15 05/13/16  Lavonia Drafts, MD  ibuprofen (ADVIL,MOTRIN) 200 MG tablet Take 800 mg by mouth 2 (two) times daily.    Historical Provider, MD  naproxen  (NAPROSYN) 500 MG tablet Take 1 tablet (500 mg total) by mouth 2 (two) times daily with a meal. 05/14/15   Lavonia Drafts, MD  sertraline (ZOLOFT) 50 MG tablet Take 1 tablet (50 mg total) by mouth daily. 08/06/15 08/05/16  Delman Kitten, MD    Allergies Amoxicillin; Bactrim [sulfamethoxazole-trimethoprim]; and Hydrocodone  No family history on file.  Social History Social History  Substance Use Topics  . Smoking status: Former Smoker    Packs/day: 0.50    Types: Cigarettes  . Smokeless tobacco: Never Used  . Alcohol use No    Review of Systems Constitutional: No fever/chills Eyes: No visual changes. ENT: No sore throat. Cardiovascular: Denies chest pain. Respiratory: Denies shortness of breath. Gastrointestinal: No abdominal pain.  No nausea, no vomiting.  No diarrhea.  No constipation. Genitourinary: Negative for dysuria. Musculoskeletal: Negative for back pain. Skin: Negative for rash. Neurological: Negative for headaches, focal weakness or numbness. Psychiatric:Positive for depression  10-point ROS otherwise negative.  ____________________________________________   PHYSICAL EXAM:  VITAL SIGNS: ED Triage Vitals [03/23/16 2228]  Enc Vitals Group     BP 117/62     Pulse Rate 86     Resp 18     Temp 99 F (37.2 C)     Temp Source Oral     SpO2 97 %     Weight 196 lb (88.9 kg)     Height 5\' 4"  (1.626 m)     Head Circumference      Peak Flow  Pain Score      Pain Loc      Pain Edu?      Excl. in [place]?     Constitutional: Alert and oriented. Well appearing and in no acute distress. Eyes: Conjunctivae are normal. PERRL. EOMI. Head: Atraumatic. Mouth/Throat: Mucous membranes are moist.  Oropharynx non-erythematous. Neck: No stridor.  No meningeal signs. Cardiovascular: Normal rate, regular rhythm. Good peripheral circulation. Grossly normal heart sounds. Respiratory: Normal respiratory effort.  No retractions. Lungs CTAB. Gastrointestinal: Soft and nontender. No  distention.  Musculoskeletal: No lower extremity tenderness nor edema. No gross deformities of extremities. Neurologic:  Normal speech and language. No gross focal neurologic deficits are appreciated.  Skin:  Skin is warm, dry and intact. No rash noted. Psychiatric: Mood and affect are normal. Speech and behavior are normal.  ____________________________________________   LABS (all labs ordered are listed, but only abnormal results are displayed)  Labs Reviewed  COMPREHENSIVE METABOLIC PANEL - Abnormal; Notable for the following:       Result Value   Total Protein 6.4 (*)    All other components within normal limits  ACETAMINOPHEN LEVEL - Abnormal; Notable for the following:    Acetaminophen (Tylenol), Serum <10 (*)    All other components within normal limits  URINE DRUG SCREEN, QUALITATIVE ([facility] ONLY) - Abnormal; Notable for the following:    Tricyclic, Ur Screen POSITIVE (*)    Cannabinoid 50 Ng, Ur [place] POSITIVE (*)    All other components within normal limits  URINALYSIS COMPLETEWITH MICROSCOPIC ([facility] ONLY) - Abnormal; Notable for the following:    Color, Urine YELLOW (*)    APPearance HAZY (*)    Leukocytes, UA 1+ (*)    Bacteria, UA RARE (*)    Squamous Epithelial / LPF 0-5 (*)    All other components within normal limits  ETHANOL  SALICYLATE LEVEL  CBC  POCT PREGNANCY, URINE       Procedures    INITIAL IMPRESSION / ASSESSMENT AND PLAN / ED COURSE  Pertinent labs & imaging results that were available during my care of the patient were reviewed by me and considered in my medical decision making (see chart for details).  Patient denies any suicidal or homicidal ideations.   Clinical Course  Patient evaluated by specialist on call who recommended discharge with outpatient follow-up. As well as a prescription for Lexapro 10 mg tablet times one week which was done.  ____________________________________________  FINAL CLINICAL IMPRESSION(S) / ED  DIAGNOSES  Final diagnoses:  Depression     MEDICATIONS GIVEN DURING THIS VISIT:  Medications - No data to display   NEW OUTPATIENT MEDICATIONS STARTED DURING THIS VISIT:  New Prescriptions   No medications on file    Modified Medications   No medications on file    Discontinued Medications   No medications on file     Note:  This document was prepared using Dragon voice recognition software and may include unintentional dictation errors.    Gregor Hams, MD 03/25/16 0010

## 2016-03-24 NOTE — ED Notes (Signed)
Pt discharged home after verbalizing understanding of discharge instructions; nad noted. Pt given bus pass.

## 2016-03-24 NOTE — ED Notes (Signed)
Signature pad did not work; pt signed copy of the discharge papers.

## 2016-03-24 NOTE — BH Assessment (Signed)
Assessment Note  Jennifer [name] is an 40 y.o. female.   Patient was brought into the ED under IVC initiated by [facility] because of suicidal thoughts.  Patient currently denies SI/HI, A/VH, and other self-injurious behaviors.  Patient reports she missed her scheduled appointment at [facility] this month but was told to come in as a walk-in to get a refill on her medications.  She reports going into [facility] yesterday and the doctor refused to write the refill of Lexpro so the patient became upset.  She yelled, "Ok, you going to be responsible if something happens to me".  The facility at that time completed an IVC and it was served and she was transported to the ED today.  Patient denies a history of suicidal gestures or inpatient hospitalizations.  Patient reports without her medication being tearful, fatigue, decreased mood, lack of motivation, anhedonia, and body pains.  Patient reports she has a supportive husband and children at home.  Patient is now regretting getting upset with the facility provider but she just wanted a refill of her medications to stop the current depression symptoms.    Dr. Owens Shark will assess for risk.    Diagnosis: Major Depression, recurrent, moderate  Past Medical History:  Past Medical History:  Diagnosis Date  . Depression   . Uterine fibroid     History reviewed. No pertinent surgical history.  Family History: No family history on file.  Social History:  reports that she has quit smoking. Her smoking use included Cigarettes. She smoked 0.50 packs per day. She has never used smokeless tobacco. She reports that she does not drink alcohol or use drugs.  Additional Social History:  Alcohol / Drug Use Pain Medications: see charts  Prescriptions: see charts  Over the Counter: see charts History of alcohol / drug use?: No history of alcohol / drug abuse (Pt denies)  CIWA: CIWA-Ar BP: 117/62 Pulse Rate: 86 COWS:    Allergies:  Allergies  Allergen Reactions  . Amoxicillin  Swelling  . Bactrim [Sulfamethoxazole-Trimethoprim] Swelling  . Hydrocodone Other (See Comments)    headache    Home Medications:  (Not in a hospital admission)  OB/GYN Status:  Patient's last menstrual period was 03/04/2016 (exact date).  General Assessment Data Location of Assessment: [facility] ED TTS Assessment: In system Is this a Tele or Face-to-Face Assessment?: Face-to-Face Is this an Initial Assessment or a Re-assessment for this encounter?: Initial Assessment Marital status: Married Is patient pregnant?: No Pregnancy Status: No Living Arrangements: Spouse/significant other, Children Can pt return to current living arrangement?: Yes Admission Status: Involuntary Is patient capable of signing voluntary admission?: No Referral Source: Psychiatrist Insurance type: none  Medical Screening Exam ([place]) Medical Exam completed: Yes  Crisis Care Plan Living Arrangements: Spouse/significant other, Children Name of Psychiatrist: [place] Name of Therapist: [facility]  Education Status Is patient currently in school?: No Current Grade: na Highest grade of school patient has completed: 12 Name of school: na Contact person: na  Risk to self with the past 6 months Suicidal Ideation: No-Not Currently/Within Last 6 Months Has patient been a risk to self within the past 6 months prior to admission? : No Suicidal Intent: No-Not Currently/Within Last 6 Months Has patient had any suicidal intent within the past 6 months prior to admission? : No Is patient at risk for suicide?: No Suicidal Plan?: No-Not Currently/Within Last 6 Months Has patient had any suicidal plan within the past 6 months prior to admission? : No Access to Means: No  What has been your use of drugs/alcohol within the last 12 months?: none Previous Attempts/Gestures: No How many times?: 0 Other Self Harm Risks: na Intentional Self Injurious Behavior: None Family Suicide History: No Recent stressful life  event(s): Other (Comment) (refill of medications) Persecutory voices/beliefs?: No Depression: Yes Depression Symptoms: Tearfulness, Fatigue, Feeling angry/irritable Substance abuse history and/or treatment for substance abuse?: No  Risk to Others within the past 6 months Homicidal Ideation: No-Not Currently/Within Last 6 Months Does patient have any lifetime risk of violence toward others beyond the six months prior to admission? : No Thoughts of Harm to Others: No-Not Currently Present/Within Last 6 Months Current Homicidal Intent: No-Not Currently/Within Last 6 Months Current Homicidal Plan: No-Not Currently/Within Last 6 Months Access to Homicidal Means: No Identified Victim: na History of harm to others?: No Assessment of Violence: None Noted Violent Behavior Description: na Does patient have access to weapons?: No Criminal Charges Pending?: No Does patient have a court date: No Is patient on probation?: No  Psychosis Hallucinations: None noted Delusions: None noted  Mental Status Report Appearance/Hygiene: In scrubs Eye Contact: Fair Motor Activity: Freedom of movement Speech: Logical/coherent Level of Consciousness: Alert Mood: Anxious Affect: Anxious, Depressed Anxiety Level: Minimal Thought Processes: Relevant, Coherent Judgement: Unimpaired Orientation: Place, Person, Time, Situation, Appropriate for developmental age Obsessive Compulsive Thoughts/Behaviors: None  Cognitive Functioning Concentration: Fair Memory: Recent Intact, Remote Intact IQ: Average Insight: Fair Impulse Control: Good Appetite: Fair Weight Loss: 0 Weight Gain: 0 Sleep: Decreased Total Hours of Sleep: 4 Vegetative Symptoms: None  ADLScreening [facility] Assessment Services) Patient's cognitive ability adequate to safely complete daily activities?: Yes Patient able to express need for assistance with ADLs?: Yes Independently performs ADLs?: Yes (appropriate for developmental age)  Prior  Inpatient Therapy Prior Inpatient Therapy: No  Prior Outpatient Therapy Prior Outpatient Therapy: Yes Prior Therapy Dates: currently  Prior Therapy Facilty/Provider(s): [facility] Reason for Treatment: depression Does patient have an ACCT team?: No Does patient have Intensive In-House Services?  : No Does patient have [facility] services? : No Does patient have P4CC services?: No  ADL Screening (condition at time of admission) Patient's cognitive ability adequate to safely complete daily activities?: Yes Patient able to express need for assistance with ADLs?: Yes Independently performs ADLs?: Yes (appropriate for developmental age)       Abuse/Neglect Assessment (Assessment to be complete while patient is alone) Verbal Abuse: Denies Sexual Abuse: Denies Exploitation of patient/patient's resources: Denies Self-Neglect: Denies Values / Beliefs Cultural Requests During Hospitalization: None Spiritual Requests During Hospitalization: None Consults Spiritual Care Consult Needed: No Social Work Consult Needed: No Regulatory affairs officer (For Healthcare) Does patient have an advance directive?: No Would patient like information on creating an advanced directive?: No - patient declined information    Additional Information 1:1 In Past 12 Months?: No CIRT Risk: No Elopement Risk: No Does patient have medical clearance?: Yes     Disposition:  Disposition Initial Assessment Completed for this Encounter: Yes Disposition of Patient: Other dispositions (Pending) Other disposition(s): Other (Comment) (Pending)  On Site Evaluation by:   Reviewed with Physician:    Chesley Noon A 03/24/2016 12:24 AM

## 2017-03-17 ENCOUNTER — Emergency Department: Payer: Self-pay

## 2017-03-17 ENCOUNTER — Emergency Department
Admission: EM | Admit: 2017-03-17 | Discharge: 2017-03-17 | Disposition: A | Discharge status: Home / Self Care | Payer: Self-pay | Attending: Emergency Medicine | Admitting: Emergency Medicine

## 2017-03-17 DIAGNOSIS — D259 Leiomyoma of uterus, unspecified: Secondary | ICD-10-CM | POA: Insufficient documentation

## 2017-03-17 DIAGNOSIS — E876 Hypokalemia: Secondary | ICD-10-CM | POA: Insufficient documentation

## 2017-03-17 DIAGNOSIS — N3000 Acute cystitis without hematuria: Secondary | ICD-10-CM | POA: Insufficient documentation

## 2017-03-17 DIAGNOSIS — Z87891 Personal history of nicotine dependence: Secondary | ICD-10-CM | POA: Insufficient documentation

## 2017-03-17 DIAGNOSIS — R1900 Intra-abdominal and pelvic swelling, mass and lump, unspecified site: Secondary | ICD-10-CM

## 2017-03-17 DIAGNOSIS — R110 Nausea: Secondary | ICD-10-CM | POA: Insufficient documentation

## 2017-03-17 DIAGNOSIS — R350 Frequency of micturition: Secondary | ICD-10-CM | POA: Insufficient documentation

## 2017-03-17 LAB — URINALYSIS, COMPLETE (UACMP) WITH MICROSCOPIC
BILIRUBIN URINE: NEGATIVE
Bacteria, UA: NONE SEEN
GLUCOSE, UA: NEGATIVE mg/dL
Hgb urine dipstick: NEGATIVE
KETONES UR: NEGATIVE mg/dL
Leukocytes, UA: NEGATIVE
NITRITE: POSITIVE — AB
PH: 5 (ref 5.0–8.0)
Protein, ur: 30 mg/dL — AB
SPECIFIC GRAVITY, URINE: 1.026 (ref 1.005–1.030)

## 2017-03-17 LAB — COMPREHENSIVE METABOLIC PANEL
ALK PHOS: 41 U/L (ref 38–126)
ALT: 21 U/L (ref 14–54)
ANION GAP: 8 (ref 5–15)
AST: 25 U/L (ref 15–41)
Albumin: 3.6 g/dL (ref 3.5–5.0)
BILIRUBIN TOTAL: 0.5 mg/dL (ref 0.3–1.2)
BUN: 22 mg/dL — ABNORMAL HIGH (ref 6–20)
CALCIUM: 8.9 mg/dL (ref 8.9–10.3)
CO2: 25 mmol/L (ref 22–32)
CREATININE: 0.79 mg/dL (ref 0.44–1.00)
Chloride: 106 mmol/L (ref 101–111)
Glucose, Bld: 104 mg/dL — ABNORMAL HIGH (ref 65–99)
Potassium: 3.2 mmol/L — ABNORMAL LOW (ref 3.5–5.1)
Sodium: 139 mmol/L (ref 135–145)
TOTAL PROTEIN: 5.7 g/dL — AB (ref 6.5–8.1)

## 2017-03-17 LAB — CBC
HCT: 44.4 % (ref 35.0–47.0)
HEMOGLOBIN: 15.3 g/dL (ref 12.0–16.0)
MCH: 32.6 pg (ref 26.0–34.0)
MCHC: 34.4 g/dL (ref 32.0–36.0)
MCV: 94.6 fL (ref 80.0–100.0)
PLATELETS: 269 10*3/uL (ref 150–440)
RBC: 4.7 MIL/uL (ref 3.80–5.20)
RDW: 13.3 % (ref 11.5–14.5)
WBC: 10.3 10*3/uL (ref 3.6–11.0)

## 2017-03-17 LAB — CHLAMYDIA/NGC RT PCR (ARMC ONLY)
CHLAMYDIA TR: NOT DETECTED
N gonorrhoeae: NOT DETECTED

## 2017-03-17 LAB — WET PREP, GENITAL
CLUE CELLS WET PREP: NONE SEEN
SPERM: NONE SEEN
TRICH WET PREP: NONE SEEN
WBC, Wet Prep HPF POC: NONE SEEN
YEAST WET PREP: NONE SEEN

## 2017-03-17 LAB — POCT PREGNANCY, URINE: Preg Test, Ur: NEGATIVE

## 2017-03-17 LAB — LIPASE, BLOOD: LIPASE: 22 U/L (ref 11–51)

## 2017-03-17 MED ORDER — KETOROLAC TROMETHAMINE 60 MG/2ML IM SOLN
INTRAMUSCULAR | Status: AC
  Filled 2017-03-17: qty 2

## 2017-03-17 MED ORDER — LEVOFLOXACIN 500 MG PO TABS
ORAL_TABLET | ORAL | Status: AC
  Filled 2017-03-17: qty 1

## 2017-03-17 MED ORDER — POTASSIUM CHLORIDE CRYS ER 20 MEQ PO TBCR
40.0000 meq | EXTENDED_RELEASE_TABLET | Freq: Once | ORAL | Status: AC
  Administered 2017-03-17: 40 meq via ORAL

## 2017-03-17 MED ORDER — LEVOFLOXACIN 250 MG PO TABS: 250.0000 mg | ORAL_TABLET | Freq: Every day | ORAL | 0 refills | Status: AC

## 2017-03-17 MED ORDER — LEVOFLOXACIN 250 MG PO TABS
250.0000 mg | ORAL_TABLET | Freq: Once | ORAL | Status: AC
Start: 2017-03-17 — End: 2017-03-17
  Administered 2017-03-17: 250 mg via ORAL

## 2017-03-17 MED ORDER — ONDANSETRON 4 MG PO TBDP
4.0000 mg | ORAL_TABLET | Freq: Once | ORAL | Status: AC | PRN
  Administered 2017-03-17: 4 mg via ORAL
  Filled 2017-03-17: qty 1

## 2017-03-17 MED ORDER — KETOROLAC TROMETHAMINE 60 MG/2ML IM SOLN
60.0000 mg | Freq: Once | INTRAMUSCULAR | Status: AC
  Administered 2017-03-17: 60 mg via INTRAMUSCULAR

## 2017-03-17 MED ORDER — POTASSIUM CHLORIDE CRYS ER 20 MEQ PO TBCR
EXTENDED_RELEASE_TABLET | ORAL | Status: AC
  Administered 2017-03-17: 40 meq via ORAL
  Filled 2017-03-17: qty 2

## 2017-03-17 NOTE — ED Triage Notes (Signed)
Pt c/o bilateral pelvic pain, burning with urination and nausea "for weeks". Pt alert and oriented X4, active, cooperative, pt in NAD. RR even and unlabored, color WNL.

## 2017-03-17 NOTE — ED Notes (Signed)
Patient transported to Ultrasound 

## 2017-03-17 NOTE — ED Notes (Signed)
Called pt no answer, pt in cafeteria

## 2017-03-17 NOTE — Discharge Instructions (Signed)
Please drink plenty of fluid and take the entire course of antibiotics, even if you are feeling better.  You may take Tylenol or Motrin for your pain.  Your pelvic pain may be from the UTI and go away once your infection is treated.  However, you do have a large uterine fibroid, which may also cause your pain.  If your symptoms continue even after you have finished the antibiotics, schedule an appointment with the gynecologist for further evaluation of your fibroid.    Return to the emergency department if you develop severe pain, nausea or vomiting, fever, or any other symptoms concerning to you.

## 2017-03-17 NOTE — ED Provider Notes (Signed)
[facility] Emergency Department Provider Note  ____________________________________________  Time seen: Approximately 2:24 PM  I have reviewed the triage vital signs and the nursing notes.   HISTORY  Chief Complaint Pelvic Pain; Nausea; and Urinary Frequency    HPI Jennifer [name] is a 41 y.o. female otherwise healthy, presenting with dysuria, suprapubic pain. The patient reports that since July, she has had pain and burning with urination. In addition she notes foul-smelling discharge that has a change in the usual color. She has had associated suprapubic cramping and discomfort.She has not noted any nausea or vomiting, fevers or chills. She has tried Aleve with minimal relief.   Past Medical History:  Diagnosis Date  . Depression   . Uterine fibroid     There are no active problems to display for this patient.   History reviewed. No pertinent surgical history.  Current Outpatient Rx  . Order #: [id number] Class: Historical Med  . Order #: [id number] Class: Print  . Order #: [id number] Class: Historical Med  . Order #: [id number] Class: Print  . Order #: [id number] Class: Print  . Order #: [id number] Class: Print    Allergies Amoxicillin; Bactrim [sulfamethoxazole-trimethoprim]; and Hydrocodone  No family history on file.  Social History Social History  Substance Use Topics  . Smoking status: Former Smoker    Packs/day: 0.50    Types: Cigarettes  . Smokeless tobacco: Never Used  . Alcohol use No    Review of Systems Constitutional: No fever/chills.no lightheadedness or yncope. Eyes: No visual changes. ENT:  No congestion or rhinorrhea. Cardiovascular: Denies chest pain. Denies palpitations. Respiratory: Denies shortness of breath.  No cough. Gastrointestinal: has a suprapubicabdominal pain.  No nausea, no vomiting.  No diarrhea.  No constipation. Genitourinary: positivefor dysuria.positive for change in vaginal discharge. Musculoskeletal:  Negative for back pain. Skin: Negative for rash. Neurological: Negative for headaches. No focal numbness, tingling or weakness.     ____________________________________________   PHYSICAL EXAM:  VITAL SIGNS: ED Triage Vitals  Enc Vitals Group     BP 03/17/17 1023 125/66     Pulse Rate 03/17/17 1023 84     Resp 03/17/17 1023 18     Temp 03/17/17 1023 98.7 F (37.1 C)     Temp Source 03/17/17 1023 Oral     SpO2 03/17/17 1023 97 %     Weight 03/17/17 1023 181 lb (82.1 kg)     Height 03/17/17 1023 5\' 4"  (1.626 m)     Head Circumference --      Peak Flow --      Pain Score 03/17/17 1022 7     Pain Loc --      Pain Edu? --      Excl. in [place]? --     Constitutional: Alert and oriented. Well appearing and in no acute distress. Answers questions appropriately. Eyes: Conjunctivae are normal.  EOMI. No scleral icterus. Head: Atraumatic. Nose: No congestion/rhinnorhea. Mouth/Throat: Mucous membranes are moist.  Neck: No stridor.  Supple.   Cardiovascular: Normal rate, regular rhythm. No murmurs, rubs or gallops.  Respiratory: Normal respiratory effort.  No accessory muscle use or retractions. Lungs CTAB.  No wheezes, rales or ronchi. Gastrointestinal: Soft, and nondistended.  Mild tenderness to palpation in the suprapubic area.No guarding or rebound.  No peritoneal signs. Genitourinary: Normal-appearing external genitalia without lesions. Vaginal exam with clear whitedischarge, normal-appearing cervix, normal vaginal wall tissue. Bimanual exam is negative for CMT, adnexal tenderness to palpation. Patient has a large palpable mass that can be felt on  the anterior aspect of the vaginal wall up to4 mm below the umbilicus ballotable and nontender. Musculoskeletal: No LE edema.  Neurologic:  A&Ox3.  Speech is clear.  Face and smile are symmetric.  EOMI.  Moves all extremities well. Skin:  Skin is warm, dry and intact. No rash noted. Psychiatric: Mood and affect are normal. Speech and  behavior are normal.  Normal judgement.  ____________________________________________   LABS (all labs ordered are listed, but only abnormal results are displayed)  Labs Reviewed  COMPREHENSIVE METABOLIC PANEL - Abnormal; Notable for the following:       Result Value   Potassium 3.2 (*)    Glucose, Bld 104 (*)    BUN 22 (*)    Total Protein 5.7 (*)    All other components within normal limits  URINALYSIS, COMPLETE (UACMP) WITH MICROSCOPIC - Abnormal; Notable for the following:    Color, Urine AMBER (*)    APPearance CLEAR (*)    Protein, ur 30 (*)    Nitrite POSITIVE (*)    Squamous Epithelial / LPF 6-30 (*)    All other components within normal limits  CHLAMYDIA/NGC RT PCR ([facility] ONLY)  WET PREP, GENITAL  URINE CULTURE  LIPASE, BLOOD  CBC  POC URINE PREG, ED  POCT PREGNANCY, URINE   ____________________________________________  EKG  Not indicated ____________________________________________  RADIOLOGY  US Pelvic Complete With Transvaginal  Result Date: 03/17/2017 CLINICAL DATA:  Bilateral pelvic pain for 3 months. History of fibroid. EXAM: TRANSABDOMINAL AND TRANSVAGINAL ULTRASOUND OF PELVIS TECHNIQUE: Both transabdominal and transvaginal ultrasound examinations of the pelvis were performed. Transabdominal technique was performed for global imaging of the pelvis including uterus, ovaries, adnexal regions, and pelvic cul-de-sac. It was necessary to proceed with endovaginal exam following the transabdominal exam to visualize the endometrial complex and adnexal regions to an adequate degree. COMPARISON:  Pelvic ultrasound dated 02/21/2015. FINDINGS: Uterus Measurements: 11.4 x 5.2 x 4.7 cm. Large mass is again seen exophytic to the posterior- lateral surface of the uterus, measuring 11.7 x 9.6 x 11.2 cm, presumed exophytic fibroid. Uterus otherwise unremarkable. Endometrium Thickness: 5 mm. No mass or fluid is seen within the endometrial canal. Right ovary  Measurements: 4 x 1.6 x 3 cm. Normal appearance/no adnexal mass. Left ovary Not seen.  No mass or free fluid identified in the left adnexa. Other findings No abnormal free fluid. IMPRESSION: 1. The large pelvic mass, presumed exophytic fibroid arising from the posterolateral portion of the uterus, has increased in size with a current measurement of 11.7 x 9.6 x 11.2 cm (measured at 8 cm greatest dimension on earlier pelvic ultrasound of 02/21/2015). 2. Uterus is otherwise unremarkable. Endometrial thickness is normal. 3. Right ovary appears normal and there is no mass or free fluid identified in the right adnexal region. 4. Left ovary is not seen but there is no mass or free fluid identified in the left adnexal region. Electronically Signed   By: Franki Cabot M.D.   On: 03/17/2017 16:12    ____________________________________________   PROCEDURES  Procedure(s) performed: None  Procedures  Critical Care performed: No ____________________________________________   INITIAL IMPRESSION / ASSESSMENT AND PLAN / ED COURSE  Pertinent labs & imaging results that were available during my care of the patient were reviewed by me and considered in my medical decision making (see chart for details).  41 y.o. female, nonpregnant, presenting with urinary and vaginal symptoms. The patient has reassuring vital signs and is afebrile today. She does not have an abdominal examination  which is consistent with an acute surgical abdominal process. Her urine does show positive nitrites and I will treat her for a UTI, and sent her urine for culture given the length of time she has been symptomatic.we'll perform a pelvic examination and evaluate her for an intravaginal infection. I'll treat the patient's symptoms with Toradol, and anticipate discharge home with close gynecologic follow-up.  ----------------------------------------- 2:38 PM on 03/17/2017 -----------------------------------------  After the patient's  current clinical examination, I do feel a mass in the pelvis. This will need further evaluation with ultrasound; she does have a history of uterine fibroid and this may be a fibroid but we will rule out any other potential etiology.  ----------------------------------------- 4:43 PM on 03/17/2017 -----------------------------------------  The patient's pain has significantly improved at this time.  The patien does not have any evidence of pelvic infection.  She will be treated for UTI.  Her pelvic u/s shows enlarged fibroid compared to previous.  I have discussed this with her.  She is stable for d/c at this time and understands return precautions as well as follow up instructions.  ____________________________________________  FINAL CLINICAL IMPRESSION(S) / ED DIAGNOSES  Final diagnoses:  Hypokalemia  Uterine leiomyoma, unspecified location  Acute cystitis without hematuria         NEW MEDICATIONS STARTED DURING THIS VISIT:  New Prescriptions   LEVOFLOXACIN (LEVAQUIN) 250 MG TABLET    Take 1 tablet (250 mg total) by mouth daily.      Eula Listen, MD 03/17/17 [phone]

## 2017-03-20 LAB — URINE CULTURE: Culture: 50000 — AB

## 2019-08-31 ENCOUNTER — Emergency Department
Admission: EM | Admit: 2019-08-31 | Discharge: 2019-08-31 | Disposition: A | Discharge status: Home / Self Care | Payer: Self-pay | Attending: Emergency Medicine | Admitting: Emergency Medicine

## 2019-08-31 ENCOUNTER — Other Ambulatory Visit: Payer: Self-pay

## 2019-08-31 ENCOUNTER — Encounter: Payer: Self-pay | Admitting: Emergency Medicine

## 2019-08-31 ENCOUNTER — Emergency Department: Payer: Self-pay

## 2019-08-31 DIAGNOSIS — N3001 Acute cystitis with hematuria: Secondary | ICD-10-CM | POA: Insufficient documentation

## 2019-08-31 DIAGNOSIS — R0602 Shortness of breath: Secondary | ICD-10-CM | POA: Insufficient documentation

## 2019-08-31 DIAGNOSIS — Z87891 Personal history of nicotine dependence: Secondary | ICD-10-CM | POA: Insufficient documentation

## 2019-08-31 DIAGNOSIS — R609 Edema, unspecified: Secondary | ICD-10-CM | POA: Insufficient documentation

## 2019-08-31 DIAGNOSIS — Z791 Long term (current) use of non-steroidal anti-inflammatories (NSAID): Secondary | ICD-10-CM | POA: Insufficient documentation

## 2019-08-31 LAB — LIPASE, BLOOD: Lipase: 24 U/L (ref 11–51)

## 2019-08-31 LAB — CBC
HCT: 40.9 % (ref 36.0–46.0)
Hemoglobin: 13.3 g/dL (ref 12.0–15.0)
MCH: 31.9 pg (ref 26.0–34.0)
MCHC: 32.5 g/dL (ref 30.0–36.0)
MCV: 98.1 fL (ref 80.0–100.0)
Platelets: 299 10*3/uL (ref 150–400)
RBC: 4.17 MIL/uL (ref 3.87–5.11)
RDW: 14.6 % (ref 11.5–15.5)
WBC: 7 10*3/uL (ref 4.0–10.5)
nRBC: 0 % (ref 0.0–0.2)

## 2019-08-31 LAB — URINALYSIS, COMPLETE (UACMP) WITH MICROSCOPIC
Bacteria, UA: NONE SEEN
Bilirubin Urine: NEGATIVE
Glucose, UA: NEGATIVE mg/dL
Ketones, ur: NEGATIVE mg/dL
Nitrite: NEGATIVE
Protein, ur: NEGATIVE mg/dL
Specific Gravity, Urine: 1.014 (ref 1.005–1.030)
WBC, UA: >50 WBC/hpf — ABNORMAL HIGH (ref 0–5)
pH: 8 (ref 5.0–8.0)

## 2019-08-31 LAB — COMPREHENSIVE METABOLIC PANEL
ALT: 29 U/L (ref 0–44)
AST: 31 U/L (ref 15–41)
Albumin: 4 g/dL (ref 3.5–5.0)
Alkaline Phosphatase: 51 U/L (ref 38–126)
Anion gap: 11 (ref 5–15)
BUN: 16 mg/dL (ref 6–20)
CO2: 26 mmol/L (ref 22–32)
Calcium: 9 mg/dL (ref 8.9–10.3)
Chloride: 102 mmol/L (ref 98–111)
Creatinine, Ser: 0.75 mg/dL (ref 0.44–1.00)
GFR calc Af Amer: >60 mL/min (ref >60)
GFR calc non Af Amer: >60 mL/min (ref >60)
Glucose, Bld: 97 mg/dL (ref 70–99)
Potassium: 4.2 mmol/L (ref 3.5–5.1)
Sodium: 139 mmol/L (ref 135–145)
Total Bilirubin: 0.6 mg/dL (ref 0.3–1.2)
Total Protein: 6.8 g/dL (ref 6.5–8.1)

## 2019-08-31 LAB — PREGNANCY, URINE: Preg Test, Ur: NEGATIVE

## 2019-08-31 LAB — BRAIN NATRIURETIC PEPTIDE: B Natriuretic Peptide: 91 pg/mL (ref 0.0–100.0)

## 2019-08-31 MED ORDER — SULFAMETHOXAZOLE-TRIMETHOPRIM 800-160 MG PO TABS
1.0000 | ORAL_TABLET | Freq: Once | ORAL | Status: AC
  Administered 2019-08-31: 1 via ORAL
  Filled 2019-08-31: qty 1

## 2019-08-31 MED ORDER — FUROSEMIDE 20 MG PO TABS
10.0000 mg | ORAL_TABLET | Freq: Once | ORAL | Status: DC
  Filled 2019-08-31: qty 0.5

## 2019-08-31 MED ORDER — SULFAMETHOXAZOLE-TRIMETHOPRIM 800-160 MG PO TABS: 1.0000 | ORAL_TABLET | Freq: Two times a day (BID) | ORAL | 0 refills | Status: DC

## 2019-08-31 MED ORDER — FUROSEMIDE 20 MG PO TABS: 20.0000 mg | ORAL_TABLET | Freq: Every day | ORAL | 0 refills | Status: DC | PRN

## 2019-08-31 NOTE — ED Notes (Signed)
Pt ambulatory from triage with steady gait. Reports bilateral ankle/feet swelling and left hand tingling x 2 months. Reports pelvic pain that she thinks is associated with a uterine fibroid. A&Ox4, NAD noted

## 2019-08-31 NOTE — Discharge Instructions (Signed)
Please pick your medicines up at [facility] tomorrow and take them as prescribed.  As we discussed, Bactrim was on your allergy list but will now be removed since you do not recall having any issues. If you develop any symptoms of concern, please stop taking the medication and take benadryl. For severe reaction or concerns, return to the ER immediately.

## 2019-08-31 NOTE — ED Triage Notes (Addendum)
C/o lower abdominal/pelvic pain that she thinks is from her uterine fibroid.  No vaginal bleeding.  Also c/o lower back pains that she thinks is from urinary problems.  Painful urination.  No fevers.  C/o swelling in legs bilateral for 2 months.  Denies chest pain/tightness.  Denies hx CHF.  Unlabored at this time. Has SHOB all the time but feels it is worse than normal.  No cough.  No orthopnea.  + smoker.  SHOB is worse on exertion. VSS.  Has gained 5 lb in last week.  Pt gives plasma and was 244 lb Tuesday and 249 yesterday at plasma center.  Pt reports was 197 lb last summer.

## 2019-09-02 NOTE — ED Provider Notes (Signed)
[facility] Emergency Department Provider Note ____________________________________________   None    (approximate)  I have reviewed the triage vital signs and the nursing notes.   HISTORY  Chief Complaint MULTIPLE COMPLAINTS  HPI Jennifer [name] is a 44 y.o. female presenting to the emergency department for treatment and evaluation of multiple medical complaints.  She states that she has had some pelvic pain for the past week or more with dysuria.  She also states that she has had bilateral lower extremity swelling for the past 2 months.  She has taken over-the-counter "fluid pills."  She denies chest pain, shortness of breath, cough, history of DVT or PE.  She is not currently on any blood thinners.  She denies any history of blood disorder.  She is not on any birth control.  She does smoke cigarettes.  She denies recent immobilization or long travel.         Past Medical History:  Diagnosis Date  . Depression   . Uterine fibroid     There are no problems to display for this patient.   History reviewed. No pertinent surgical history.  Prior to Admission medications   Medication Sig Start Date End Date Taking? Authorizing Provider  albuterol (PROVENTIL HFA;VENTOLIN HFA) 108 (90 BASE) MCG/ACT inhaler Inhale 2 puffs into the lungs every 6 (six) hours as needed for wheezing or shortness of breath.    [provider]  escitalopram (LEXAPRO) 10 MG tablet Take 1 tablet (10 mg total) by mouth daily. 03/24/16 03/31/16  Gregor Hams, MD  furosemide (LASIX) 20 MG tablet Take 1 tablet (20 mg total) by mouth daily as needed for up to 14 days. 08/31/19 09/14/19  Sophie Quiles, Johnette Abraham B, FNP  ibuprofen (ADVIL,MOTRIN) 200 MG tablet Take 800 mg by mouth 2 (two) times daily.    [provider]  naproxen (NAPROSYN) 500 MG tablet Take 1 tablet (500 mg total) by mouth 2 (two) times daily with a meal. 05/14/15   Lavonia Drafts, MD  sertraline (ZOLOFT) 50 MG  tablet Take 1 tablet (50 mg total) by mouth daily. 08/06/15 08/05/16  Delman Kitten, MD  sulfamethoxazole-trimethoprim (BACTRIM DS) 800-160 MG tablet Take 1 tablet by mouth 2 (two) times daily. 08/31/19   Mishal Probert, Johnette Abraham B, FNP    Allergies Amoxicillin and Hydrocodone  History reviewed. No pertinent family history.  Social History Social History   Tobacco Use  . Smoking status: Former Smoker    Packs/day: 0.50    Types: Cigarettes  . Smokeless tobacco: Never Used  Substance Use Topics  . Alcohol use: No  . Drug use: No    Review of Systems  Constitutional: No fever/chills Eyes: No visual changes. ENT: No sore throat. Cardiovascular: Denies chest pain. Respiratory: Denies shortness of breath. Gastrointestinal: No abdominal pain.  No nausea, no vomiting.  No diarrhea.  No constipation. Genitourinary: Positive for dysuria. Musculoskeletal: Positive for bilateral lower extremity edema.. Skin: Negative for rash. Neurological: Negative for headaches, focal weakness or numbness. ____________________________________________   PHYSICAL EXAM:  VITAL SIGNS: ED Triage Vitals  Enc Vitals Group     BP 08/31/19 1636 (!) 141/75     Pulse Rate 08/31/19 1636 82     Resp 08/31/19 1636 16     Temp 08/31/19 1636 98.3 F (36.8 C)     Temp Source 08/31/19 1636 Oral     SpO2 08/31/19 1636 95 %     Weight 08/31/19 1636 250 lb (113.4 kg)  Height 08/31/19 1636 5\' 4"  (1.626 m)     Head Circumference --      Peak Flow --      Pain Score 08/31/19 1644 0     Pain Loc --      Pain Edu? --      Excl. in [place]? --     Constitutional: Alert and oriented. Well appearing and in no acute distress. Eyes: Conjunctivae are normal. PERRL. EOMI. Head: Atraumatic. Nose: No congestion/rhinnorhea. Mouth/Throat: Mucous membranes are moist.  Oropharynx non-erythematous. Neck: No stridor.   Hematological/Lymphatic/Immunilogical: No cervical lymphadenopathy. Cardiovascular: Normal rate, regular rhythm.  Grossly normal heart sounds.  Good peripheral circulation.  Pedal pulses 2+ Respiratory: Normal respiratory effort.  No retractions. Lungs CTAB. Gastrointestinal: Soft and nontender. No distention. No abdominal bruits. No CVA tenderness. Genitourinary:  Musculoskeletal: No lower extremity tenderness nor edema.  No joint effusions. Neurologic:  Normal speech and language. No gross focal neurologic deficits are appreciated. No gait instability. Skin:  Skin is warm, dry and intact. No rash noted.  Bilateral lower extremity nonpitting edema noted in the feet and ankles.  No erythematous areas or lesions. Psychiatric: Mood and affect are normal. Speech and behavior are normal.  ____________________________________________   LABS (all labs ordered are listed, but only abnormal results are displayed)  Labs Reviewed  URINALYSIS, COMPLETE (UACMP) WITH MICROSCOPIC - Abnormal; Notable for the following components:      Result Value   APPearance CLEAR (*)    Hgb urine dipstick SMALL (*)    Leukocytes,Ua MODERATE (*)    WBC, UA >50 (*)    All other components within normal limits  LIPASE, BLOOD  COMPREHENSIVE METABOLIC PANEL  CBC  BRAIN NATRIURETIC PEPTIDE  PREGNANCY, URINE   ____________________________________________  EKG  Not indicated ____________________________________________  RADIOLOGY  ED MD interpretation:    Chest x-ray negative for acute cardiopulmonary abnormalities. I, Sherrie George, personally viewed and evaluated these images (plain radiographs) as part of my medical decision making, as well as reviewing the written report by the radiologist.  Official radiology report(s): No results found.  ____________________________________________   PROCEDURES  Procedure(s) performed (including Critical Care):  Procedures  ____________________________________________   INITIAL IMPRESSION / ASSESSMENT AND PLAN     44 year old female presenting to the emergency  department for symptoms as described in the HPI.  While awaiting ER room assignment, protocol lab studies were completed as well as EKG and chest x-ray.  Labs are all reassuring with the exception of the urinalysis which has moderate leukocytes, greater than 50 white blood cells, 21-50 red blood cells and a small amount hemoglobin.  DIFFERENTIAL DIAGNOSIS  Nephrolithiasis, acute cystitis, pyelonephritis CHF, CAD, vascular insufficiency, peripheral edema not otherwise specified.  ED COURSE  Urinalysis is concerning for acute cystitis and she will be placed on antibiotic.  She was advised to decrease her salt intake and to use compression stockings.  She states that she has taken some Lasix in the past which has helped and requests that she have a prescription today.  She was advised to call and schedule follow-up appointment with her primary care provider.  She is to return to the emergency department for symptoms of change or worsen if she is unable to schedule an appointment. ____________________________________________   FINAL CLINICAL IMPRESSION(S) / ED DIAGNOSES  Final diagnoses:  Acute cystitis with hematuria  Peripheral edema     ED Discharge Orders         Ordered    furosemide (LASIX) 20 MG tablet  Daily PRN     08/31/19 1810    sulfamethoxazole-trimethoprim (BACTRIM DS) 800-160 MG tablet  2 times daily     08/31/19 1810           Jennifer [name] was evaluated in Emergency Department on 09/02/2019 for the symptoms described in the history of present illness. She was evaluated in the context of the global COVID-19 pandemic, which necessitated consideration that the patient might be at risk for infection with the SARS-CoV-2 virus that causes COVID-19. Institutional protocols and algorithms that pertain to the evaluation of patients at risk for COVID-19 are in a state of rapid change based on information released by regulatory bodies including the CDC and federal and state  organizations. These policies and algorithms were followed during the patient's care in the ED.   Note:  This document was prepared using Dragon voice recognition software and may include unintentional dictation errors.   Victorino Dike, FNP 09/02/19 2326    Arta Silence, MD 09/03/19 [phone]

## 2019-10-14 ENCOUNTER — Ambulatory Visit: Payer: Self-pay

## 2019-10-17 ENCOUNTER — Ambulatory Visit: Payer: Self-pay

## 2019-10-30 ENCOUNTER — Inpatient Hospital Stay
Admission: EM | Admit: 2019-10-30 | Discharge: 2019-11-03 | DRG: 742 | Disposition: A | Discharge status: Home / Self Care | Payer: Self-pay | Attending: Obstetrics and Gynecology | Admitting: Obstetrics and Gynecology

## 2019-10-30 ENCOUNTER — Telehealth: Payer: Self-pay | Admitting: Urology

## 2019-10-30 ENCOUNTER — Emergency Department: Payer: Self-pay

## 2019-10-30 ENCOUNTER — Other Ambulatory Visit: Payer: Self-pay

## 2019-10-30 DIAGNOSIS — F329 Major depressive disorder, single episode, unspecified: Secondary | ICD-10-CM | POA: Diagnosis present

## 2019-10-30 DIAGNOSIS — Z87891 Personal history of nicotine dependence: Secondary | ICD-10-CM

## 2019-10-30 DIAGNOSIS — N390 Urinary tract infection, site not specified: Secondary | ICD-10-CM | POA: Diagnosis present

## 2019-10-30 DIAGNOSIS — N801 Endometriosis of ovary: Secondary | ICD-10-CM | POA: Diagnosis present

## 2019-10-30 DIAGNOSIS — N201 Calculus of ureter: Secondary | ICD-10-CM | POA: Diagnosis present

## 2019-10-30 DIAGNOSIS — N941 Unspecified dyspareunia: Secondary | ICD-10-CM | POA: Diagnosis present

## 2019-10-30 DIAGNOSIS — Z20822 Contact with and (suspected) exposure to covid-19: Secondary | ICD-10-CM | POA: Diagnosis present

## 2019-10-30 DIAGNOSIS — Z87442 Personal history of urinary calculi: Secondary | ICD-10-CM

## 2019-10-30 DIAGNOSIS — N134 Hydroureter: Secondary | ICD-10-CM | POA: Diagnosis present

## 2019-10-30 DIAGNOSIS — N83202 Unspecified ovarian cyst, left side: Secondary | ICD-10-CM | POA: Diagnosis present

## 2019-10-30 DIAGNOSIS — Z6841 Body Mass Index (BMI) 40.0 and over, adult: Secondary | ICD-10-CM

## 2019-10-30 DIAGNOSIS — D259 Leiomyoma of uterus, unspecified: Principal | ICD-10-CM | POA: Diagnosis present

## 2019-10-30 DIAGNOSIS — Z9889 Other specified postprocedural states: Secondary | ICD-10-CM

## 2019-10-30 DIAGNOSIS — F419 Anxiety disorder, unspecified: Secondary | ICD-10-CM | POA: Diagnosis present

## 2019-10-30 LAB — URINALYSIS, COMPLETE (UACMP) WITH MICROSCOPIC
Bacteria, UA: NONE SEEN
Bilirubin Urine: NEGATIVE
Glucose, UA: NEGATIVE mg/dL
Ketones, ur: NEGATIVE mg/dL
Nitrite: NEGATIVE
Protein, ur: 100 mg/dL — AB
RBC / HPF: >50 RBC/hpf — ABNORMAL HIGH (ref 0–5)
Specific Gravity, Urine: 1.017 (ref 1.005–1.030)
WBC, UA: >50 WBC/hpf — ABNORMAL HIGH (ref 0–5)
pH: 5 (ref 5.0–8.0)

## 2019-10-30 LAB — RESPIRATORY PANEL BY RT PCR (FLU A&B, COVID)
Influenza A by PCR: NEGATIVE
Influenza B by PCR: NEGATIVE
SARS Coronavirus 2 by RT PCR: NEGATIVE

## 2019-10-30 LAB — CBC
HCT: 41.2 % (ref 36.0–46.0)
Hemoglobin: 13.4 g/dL (ref 12.0–15.0)
MCH: 32.1 pg (ref 26.0–34.0)
MCHC: 32.5 g/dL (ref 30.0–36.0)
MCV: 98.6 fL (ref 80.0–100.0)
Platelets: 278 10*3/uL (ref 150–400)
RBC: 4.18 MIL/uL (ref 3.87–5.11)
RDW: 15 % (ref 11.5–15.5)
WBC: 7.1 10*3/uL (ref 4.0–10.5)
nRBC: 0 % (ref 0.0–0.2)

## 2019-10-30 LAB — BASIC METABOLIC PANEL
Anion gap: 9 (ref 5–15)
BUN: 20 mg/dL (ref 6–20)
CO2: 25 mmol/L (ref 22–32)
Calcium: 8.5 mg/dL — ABNORMAL LOW (ref 8.9–10.3)
Chloride: 103 mmol/L (ref 98–111)
Creatinine, Ser: 1.04 mg/dL — ABNORMAL HIGH (ref 0.44–1.00)
GFR calc Af Amer: >60 mL/min (ref >60)
GFR calc non Af Amer: >60 mL/min (ref >60)
Glucose, Bld: 110 mg/dL — ABNORMAL HIGH (ref 70–99)
Potassium: 3.8 mmol/L (ref 3.5–5.1)
Sodium: 137 mmol/L (ref 135–145)

## 2019-10-30 LAB — POCT PREGNANCY, URINE
Preg Test, Ur: NEGATIVE
Preg Test, Ur: NEGATIVE

## 2019-10-30 MED ORDER — ONDANSETRON HCL 4 MG/2ML IJ SOLN
4.0000 mg | Freq: Four times a day (QID) | INTRAMUSCULAR | Status: DC | PRN
  Administered 2019-10-31 – 2019-11-03 (×4): 4 mg via INTRAVENOUS
  Filled 2019-10-30 (×3): qty 2

## 2019-10-30 MED ORDER — PRENATAL MULTIVITAMIN CH: 1.0000 | ORAL_TABLET | Freq: Every day | ORAL | Status: DC

## 2019-10-30 MED ORDER — ACETAMINOPHEN 500 MG PO TABS
1000.0000 mg | ORAL_TABLET | Freq: Four times a day (QID) | ORAL | Status: DC | PRN
  Administered 2019-10-31 – 2019-11-01 (×2): 1000 mg via ORAL
  Filled 2019-10-30 (×2): qty 2

## 2019-10-30 MED ORDER — ONDANSETRON HCL 4 MG PO TABS
4.0000 mg | ORAL_TABLET | Freq: Four times a day (QID) | ORAL | Status: DC | PRN
  Administered 2019-10-31: 19:00:00 4 mg via ORAL
  Filled 2019-10-30 (×2): qty 1

## 2019-10-30 MED ORDER — SODIUM CHLORIDE 0.9 % IV SOLN: 1.0000 g | Freq: Two times a day (BID) | INTRAVENOUS | Status: DC

## 2019-10-30 MED ORDER — SODIUM CHLORIDE 0.9 % IV SOLN
1.0000 g | INTRAVENOUS | Status: DC
  Administered 2019-10-31 – 2019-11-02 (×3): 1 g via INTRAVENOUS
  Filled 2019-10-30 (×3): qty 1
  Filled 2019-10-30: qty 10

## 2019-10-30 MED ORDER — SODIUM CHLORIDE 0.9 % IV SOLN
1.0000 g | Freq: Once | INTRAVENOUS | Status: AC
  Administered 2019-10-30: 1 g via INTRAVENOUS
  Filled 2019-10-30: qty 10

## 2019-10-30 MED ORDER — MORPHINE SULFATE (PF) 2 MG/ML IV SOLN
1.0000 mg | INTRAVENOUS | Status: DC | PRN
  Administered 2019-10-30: 21:00:00 2 mg via INTRAVENOUS
  Filled 2019-10-30: qty 1

## 2019-10-30 MED ORDER — OXYCODONE-ACETAMINOPHEN 5-325 MG PO TABS
1.0000 | ORAL_TABLET | ORAL | Status: DC | PRN
  Administered 2019-10-31 – 2019-11-01 (×3): 1 via ORAL
  Filled 2019-10-30 (×4): qty 1

## 2019-10-30 MED ORDER — LACTATED RINGERS IV SOLN: INTRAVENOUS | Status: DC

## 2019-10-30 NOTE — ED Notes (Signed)
purewick placed, pt educated on use

## 2019-10-30 NOTE — Telephone Encounter (Signed)
Called about this patient by the emergency room regarding incidental right renal pelvic stone.  Patient presented to the emergency room with LEFT flank pain and LLQ/ pelvic pain.  No leukocytosis.  Urine with some RBCs and WBCs but no bacteria nitrite negative.  Creatinine is normal.  Hemodynamically stable.  CT scan was personally reviewed.  Indicates an incidental RIGHT UPJ stone without a particularly obstructive appearance in some inflammatory change around the stone with some renal pelvic thickening.  Minimal to no hydronephrosis on this side.  Given that her clinical presentation is on the contralateral side, the stone is nonobstructing in appearance, would not recommend any urgent or emergent intervention although the stone ought to be addressed in the near future.  Recommend treating for possible presumed cystitis based on appearance of urine but this could also be consistent with a chronic stone.  Would send culture to rule out infection.  We will plan to see her in clinic in the next 1 to 2 days to discuss stone management.  Hollice Espy, MD

## 2019-10-30 NOTE — ED Notes (Signed)
Bladder scanner measured 66ml.

## 2019-10-30 NOTE — ED Provider Notes (Signed)
[facility] Emergency Department Provider Note ____________________________________________   First MD Initiated Contact with Patient 10/30/19 1532     (approximate)  I have reviewed the triage vital signs and the nursing notes.   HISTORY  Chief Complaint Flank Pain  HPI Jennifer [name] is a 44 y.o. female who presents to the emergency department for treatment and evaluation as left-sided back and flank pain.  Symptoms worsened over the past few days.  She is aware that she has a large uterine fibroid, but due to lack of insurance she has not been able to have the uterine fibroid removed.   Patient states that about 3 weeks ago, she noticed she was having some dysuria, bladder spasms, and felt like she was unable to empty her bladder.  Symptoms have progressively worsened.  No hematuria.  No vaginal discharge.  Last menstrual cycle was April 30 and ended on May 3.         Past Medical History:  Diagnosis Date  . Depression   . Uterine fibroid     Patient Active Problem List   Diagnosis Date Noted  . Fibroid uterus 10/30/2019    No past surgical history on file.  Prior to Admission medications   Medication Sig Start Date End Date Taking? Authorizing Provider  albuterol (PROVENTIL HFA;VENTOLIN HFA) 108 (90 BASE) MCG/ACT inhaler Inhale 2 puffs into the lungs every 6 (six) hours as needed for wheezing or shortness of breath.    [provider]  escitalopram (LEXAPRO) 10 MG tablet Take 1 tablet (10 mg total) by mouth daily. 03/24/16 03/31/16  Gregor Hams, MD  furosemide (LASIX) 20 MG tablet Take 1 tablet (20 mg total) by mouth daily as needed for up to 14 days. 08/31/19 09/14/19  Shaily Librizzi, Johnette Abraham B, FNP  ibuprofen (ADVIL,MOTRIN) 200 MG tablet Take 800 mg by mouth 2 (two) times daily.    [provider]  naproxen (NAPROSYN) 500 MG tablet Take 1 tablet (500 mg total) by mouth 2 (two) times daily with a meal. 05/14/15   Lavonia Drafts, MD    sertraline (ZOLOFT) 50 MG tablet Take 1 tablet (50 mg total) by mouth daily. 08/06/15 08/05/16  Delman Kitten, MD  sulfamethoxazole-trimethoprim (BACTRIM DS) 800-160 MG tablet Take 1 tablet by mouth 2 (two) times daily. 08/31/19   Oluwaferanmi Wain, Johnette Abraham B, FNP    Allergies Amoxicillin and Hydrocodone  No family history on file.  Social History Social History   Tobacco Use  . Smoking status: Former Smoker    Packs/day: 0.50    Types: Cigarettes  . Smokeless tobacco: Never Used  Substance Use Topics  . Alcohol use: No  . Drug use: No    Review of Systems  Constitutional: No fever/chills Eyes: No visual changes. ENT: No sore throat. Cardiovascular: Denies chest pain. Respiratory: Denies shortness of breath. Gastrointestinal: Positive for abdominal pain.  Positive for nausea, no vomiting.  No diarrhea.  No constipation. Genitourinary: Positive for dysuria. Musculoskeletal: Positive for left flank pain. Skin: Negative for rash. Neurological: Negative for headaches, focal weakness or numbness. ___________________________________________   PHYSICAL EXAM:  VITAL SIGNS: ED Triage Vitals [10/30/19 1157]  Enc Vitals Group     BP (!) 142/60     Pulse Rate 96     Resp 18     Temp 98.2 F (36.8 C)     Temp Source Oral     SpO2 96 %     Weight 240 lb (108.9 kg)  Height 5\' 4"  (1.626 m)     Head Circumference      Peak Flow      Pain Score 7     Pain Loc      Pain Edu?      Excl. in [place]?     Constitutional: Alert and oriented. Well appearing and in no acute distress. Eyes: Conjunctivae are normal. PERRL. EOMI. Head: Atraumatic. Nose: No congestion/rhinnorhea. Mouth/Throat: Mucous membranes are moist.  Oropharynx non-erythematous. Neck: No stridor.   Hematological/Lymphatic/Immunilogical: No cervical lymphadenopathy. Cardiovascular: Normal rate, regular rhythm. Grossly normal heart sounds.  Good peripheral circulation. Respiratory: Normal respiratory effort.  No retractions.  Lungs CTAB. Gastrointestinal: Soft and nontender. No distention. No abdominal bruits. No CVA tenderness. Genitourinary:  Musculoskeletal: No lower extremity tenderness nor edema.  No joint effusions. Neurologic:  Normal speech and language. No gross focal neurologic deficits are appreciated. No gait instability. Skin:  Skin is warm, dry and intact. No rash noted. Psychiatric: Mood and affect are normal. Speech and behavior are normal.  ____________________________________________   LABS (all labs ordered are listed, but only abnormal results are displayed)  Labs Reviewed  URINALYSIS, COMPLETE (UACMP) WITH MICROSCOPIC - Abnormal; Notable for the following components:      Result Value   Color, Urine YELLOW (*)    APPearance CLOUDY (*)    Hgb urine dipstick LARGE (*)    Protein, ur 100 (*)    Leukocytes,Ua LARGE (*)    RBC / HPF >50 (*)    WBC, UA >50 (*)    All other components within normal limits  BASIC METABOLIC PANEL - Abnormal; Notable for the following components:   Glucose, Bld 110 (*)    Creatinine, Ser 1.04 (*)    Calcium 8.5 (*)    All other components within normal limits  URINE CULTURE  RESPIRATORY PANEL BY RT PCR (FLU A&B, COVID)  CBC  COMPREHENSIVE METABOLIC PANEL  CBC  URINALYSIS, COMPLETE (UACMP) WITH MICROSCOPIC  POC URINE PREG, ED  POCT PREGNANCY, URINE  POCT PREGNANCY, URINE   ____________________________________________  EKG  Not indicated ____________________________________________  RADIOLOGY  ED MD interpretation:    Uterine fibroid measuring approximately 18cm in diameter potentially obstructing the right ureter. 70mm stone in the right renal pelvis.  Official radiology report(s): CT Renal Stone Study  Result Date: 10/30/2019 CLINICAL DATA:  Left flank pain EXAM: CT ABDOMEN AND PELVIS WITHOUT CONTRAST TECHNIQUE: Multidetector CT imaging of the abdomen and pelvis was performed following the standard protocol without IV contrast. COMPARISON:   CT 06/12/2009, pelvic ultrasound 03/17/2017 FINDINGS: Lower chest: Lung bases demonstrate no acute consolidation or pleural effusion. Small hiatal hernia. Thickened appearance at the GE junction. Hepatobiliary: Heterogenous hepatic steatosis. No calcified gallstone or biliary dilatation. Pancreas: Unremarkable. No pancreatic ductal dilatation or surrounding inflammatory changes. Spleen: Normal in size without focal abnormality. Adrenals/Urinary Tract: Adrenal glands are normal. Cortical scarring within the left kidney. Bilateral intrarenal stones. These measure up to 5 mm in the lower pole left kidney. 9 mm stone in the right renal pelvis. There is mild right hydronephrosis. Suspicion in urothelial thickening of the right renal pelvis. Right ureter is slightly dilated but no definitive ureteral stone. The bladder is nearly empty and is displaced to the right pelvis. Stomach/Bowel: Stomach is within normal limits. Appendix appears normal. No evidence of bowel wall thickening, distention, or inflammatory changes. Vascular/Lymphatic: Nonaneurysmal aorta. No suspicious adenopathy Reproductive: Markedly enlarged, uterus extends to above the umbilicus. Large heterogeneous uterine mass measuring at least 16.4 cm  transverse by 18 cm AP by 19 cm craniocaudad. Internal low-attenuation areas possibly due to cystic degeneration. Tubular structure containing air paralleling the left uterine mass presumably represent displaced uterus from the exophytic fibroid. Bilateral tubal occlusion devices along the anterior aspect of the mass. Other: Negative for free air or free fluid. Musculoskeletal: Degenerative changes of the spine. No acute or suspicious osseous abnormality IMPRESSION: 1. Markedly enlarged uterus with large heterogeneous uterine mass measuring up to 18 cm in diameter, suspected to represent a large exophytic fibroid. Tubular structure along the left aspect of the uterine mass, suspected to represent displaced uterus.  There is air within the vagina and endometrial cavity. Air in the endometrium could be refluxed from the vagina, correlate for any recent manipulation. 2. Mild right hydronephrosis, secondary to a 9 mm stone in the right renal pelvis. There appears to be mild urothelial thickening of the right renal pelvis which could be secondary to infection or inflammation. Right ureter is also slightly dilated but no ureteral stone is seen. Suspect that there is a distal obstruction of right ureter by the large uterine mass. There are intrarenal stones bilaterally. 3. Heterogenous hepatic steatosis. 4. Small hiatal hernia. Thickened appearance at the GE junction. Consider correlation with endoscopy. Electronically Signed   By: Donavan Foil M.D.   On: 10/30/2019 16:09    ____________________________________________   PROCEDURES  Procedure(s) performed (including Critical Care):  Procedures  ____________________________________________   INITIAL IMPRESSION / ASSESSMENT AND PLAN     43 year old female presenting to the emergency department today for treatment and evaluation of left flank pain and urinary symptoms as described in the HPI.  Protocol labs drawn while awaiting ER room assignment shows a normal white blood cell count, unremarkable BMP, and urinalysis consistent with acute cytsitis.   DIFFERENTIAL DIAGNOSIS  Uterine fibroid, ureterolithiasis, nephrolithiasis, acute cystitis, pyelonephritis  ED COURSE  Case discussed with Dr. Ouida Sills who came to the emergency department and personally evaluated the patient.  He feels that the fibroid identified on CT scan is stable at this time for outpatient follow-up and potential hysterectomy.  He has provided his contact and follow-up information.  Case also discussed with Dr. Erlene Quan in urology who states that she has reviewed the CT scan and ER notes from today.  Plan will be to treat her on an outpatient basis.   Urinalysis and CVA tenderness on  the left are concerning for acute cystitis v/s early pyelonephritis. Normal WBC and afebrile at this time. Will order 1g of Rocephin.   Change of plan, Dr. Ouida Sills plans to admit the patient and will bring her in under his services.  He will plan for a hysterectomy within the next couple of days.  He states his intention to discuss potential coordinating intervention for kidney stone with Dr. Erlene Quan.  Change in plan was discussed with the patient who agrees to stay.  ____________________________________________   FINAL CLINICAL IMPRESSION(S) / ED DIAGNOSES  Final diagnoses:  Uterine leiomyoma, unspecified location  Ureterolithiasis     ED Discharge Orders    None       Chenise G Sesay was evaluated in Emergency Department on 10/30/2019 for the symptoms described in the history of present illness. She was evaluated in the context of the global COVID-19 pandemic, which necessitated consideration that the patient might be at risk for infection with the SARS-CoV-2 virus that causes COVID-19. Institutional protocols and algorithms that pertain to the evaluation of patients at risk for COVID-19 are in a state of  rapid change based on information released by regulatory bodies including the CDC and federal and state organizations. These policies and algorithms were followed during the patient's care in the ED.   Note:  This document was prepared using Dragon voice recognition software and may include unintentional dictation errors.   Victorino Dike, FNP 10/30/19 Yevette Edwards    Arta Silence, MD 10/30/19 2121

## 2019-10-30 NOTE — ED Triage Notes (Addendum)
Pt from home via POV from. Pt c/o left flank pain, lower abd pain and lower back pain for 1 moth ago, pt presents with a hx of kidney stones and uterine fibroid 2 yrs ago. Pt c/o of dysuria, urinary retention, bladder spasms for 3 weeks. Denies hematuria. NAd noted at this time.

## 2019-10-30 NOTE — ED Notes (Signed)
Pt states left sided back/flank pain for the last 4 weeks. Pt states history of kidney stones.

## 2019-10-30 NOTE — H&P (Signed)
Consult History and Physical   SERVICE: Gynecology   Patient Name: Jennifer [name] Patient MRN:   [id number]  [id number] flank pain for the last 3 weeks .  HPI: Jennifer [name] is a 44 y.o. G4P4 all svd eft flank pain for the last 3 weeks . Worsening . Pt with a known fibroid uterus wihich is enlarging by today's CTscan . Bilateral urolithiasis without obstruction on either side according to Dr Cherrie Gauze eval . No fever. Some nausea. Ctscan shows an 18 cm fibroid that is compressing the right ureter. Menses is 5 days in length and regular. + dyspareunia for the last 1 yr     Review of Systems: positives in bold GEN:   fevers, chills, weight changes, appetite changes, fatigue, night sweats HEENT:  HA, vision changes, hearing loss, congestion, rhinorrhea, sinus pressure, dysphagia CV:   CP, palpitations PULM:  SOB, cough GI:  abd pain, N/V/D/C GU:  dysuria, urgency, frequency, left flank pain  MSK:  arthralgias, myalgias, back pain, swelling SKIN:  rashes, color changes, pallor NEURO:  numbness, weakness, tingling, seizures, dizziness, tremors PSYCH:  depression, anxiety, behavioral problems, confusion  HEME/LYMPH:  easy bruising or bleeding ENDO:  heat/cold intolerance  Past Obstetrical History: G4P4 all svd  Past Gynecologic History: No LMP recorded. Menstrual frequency Q 4 wks lasting 5-10 days  Past Medical History: Past Medical History:  Diagnosis Date  . Depression   . Uterine fibroid     Past Surgical History:  No past surgical history on file.  Family History:  No gyn cancer except breast in [place]  Social History: + tobacco , no etoh  Social History   Socioeconomic History  . Marital status: Married    Spouse name: Not on file  . Number of children: Not on file  . Years of education: Not on file  . Highest education level: Not on file  Occupational History  . Not on file  Tobacco Use  . Smoking status: Former Smoker    Packs/day: 0.50    Types: Cigarettes   . Smokeless tobacco: Never Used  Substance and Sexual Activity  . Alcohol use: No  . Drug use: No  . Sexual activity: Not on file  Other Topics Concern  . Not on file  Social History Narrative  . Not on file   Social Determinants of Health   Financial Resource Strain:   . Difficulty of Paying Living Expenses:   Food Insecurity:   . Worried About Charity fundraiser in the Last Year:   . Arboriculturist in the Last Year:   Transportation Needs:   . Film/video editor (Medical):   Marland Kitchen Lack of Transportation (Non-Medical):   Physical Activity:   . Days of Exercise per Week:   . Minutes of Exercise per Session:   Stress:   . Feeling of Stress :   Social Connections:   . Frequency of Communication with Friends and Family:   . Frequency of Social Gatherings with Friends and Family:   . Attends Religious Services:   . Active Member of Clubs or Organizations:   . Attends Archivist Meetings:   Marland Kitchen Marital Status:   Intimate Partner Violence:   . Fear of Current or Ex-Partner:   . Emotionally Abused:   Marland Kitchen Physically Abused:   . Sexually Abused:     Home Medications:  Medications reconciled in EPIC  No current facility-administered medications on file prior to encounter.   Current Outpatient Medications on  File Prior to Encounter  Medication Sig Dispense Refill  . albuterol (PROVENTIL HFA;VENTOLIN HFA) 108 (90 BASE) MCG/ACT inhaler Inhale 2 puffs into the lungs every 6 (six) hours as needed for wheezing or shortness of breath.    . escitalopram (LEXAPRO) 10 MG tablet Take 1 tablet (10 mg total) by mouth daily. 7 tablet 0  . furosemide (LASIX) 20 MG tablet Take 1 tablet (20 mg total) by mouth daily as needed for up to 14 days. 14 tablet 0  . ibuprofen (ADVIL,MOTRIN) 200 MG tablet Take 800 mg by mouth 2 (two) times daily.    . naproxen (NAPROSYN) 500 MG tablet Take 1 tablet (500 mg total) by mouth 2 (two) times daily with a meal. 20 tablet 2  . sertraline (ZOLOFT) 50  MG tablet Take 1 tablet (50 mg total) by mouth daily. 30 tablet 0  . sulfamethoxazole-trimethoprim (BACTRIM DS) 800-160 MG tablet Take 1 tablet by mouth 2 (two) times daily. 10 tablet 0    Allergies:  Allergies  Allergen Reactions  . Amoxicillin Swelling  . Hydrocodone Other (See Comments)    headache    Physical Exam:  Temp:  [98.2 F (36.8 C)] 98.2 F (36.8 C) (05/05 1157) Pulse Rate:  [96] 96 (05/05 1157) Resp:  [18] 18 (05/05 1157) BP: (142)/(60) 142/60 (05/05 1157) SpO2:  [96 %] 96 % (05/05 1157) Weight:  [108.9 kg] 108.9 kg (05/05 1157)   General Appearance:  Well developed, well nourished, no acute distress, alert and oriented x3 HEENT:  Normocephalic atraumatic, extraocular movements intact, moist mucous membranes Cardiovascular:  Normal S1/S2, regular rate and rhythm, no murmurs Pulmonary:  clear to auscultation, no wheezes, rales or rhonchi, symmetric air entry, good air exchange Abdomen:  Bowel sounds present, soft, nontender, nondistended, no abnormal masses, no epigastric pain, uterus to umbilicus  Extremities:  Full range of motion, no pedal edema, 2+ distal pulses, no tenderness Skin:  normal coloration and turgor, no rashes, no suspicious skin lesions noted  Neurologic:  Cranial nerves 2-12 grossly intact, normal muscle tone, strength 5/5 all four extremities Psychiatric:  Normal mood and affect, appropriate, no AH/VH Pelvic:  Deferred Back : mild left cvat Labs/Studies:   CBC and Coags:  Lab Results  Component Value Date   WBC 7.1 10/30/2019   NEUTOPHILPCT 89.6 08/14/2012   EOSPCT 0.2 08/14/2012   BASOPCT 0.1 08/14/2012   LYMPHOPCT 7.7 08/14/2012   HGB 13.4 10/30/2019   HCT 41.2 10/30/2019   MCV 98.6 10/30/2019   PLT 278 10/30/2019   CMP:  Lab Results  Component Value Date   NA 137 10/30/2019   K 3.8 10/30/2019   CL 103 10/30/2019   CO2 25 10/30/2019   BUN 20 10/30/2019   CREATININE 1.04 (H) 10/30/2019   CREATININE 0.75 08/31/2019    CREATININE 0.79 03/17/2017   PROT 6.8 08/31/2019   BILITOT 0.6 08/31/2019   ALT 29 08/31/2019   AST 31 08/31/2019   ALKPHOS 51 08/31/2019    Other Imaging: CT Renal Stone Study  Result Date: 10/30/2019 CLINICAL DATA:  Left flank pain EXAM: CT ABDOMEN AND PELVIS WITHOUT CONTRAST TECHNIQUE: Multidetector CT imaging of the abdomen and pelvis was performed following the standard protocol without IV contrast. COMPARISON:  CT 06/12/2009, pelvic ultrasound 03/17/2017 FINDINGS: Lower chest: Lung bases demonstrate no acute consolidation or pleural effusion. Small hiatal hernia. Thickened appearance at the GE junction. Hepatobiliary: Heterogenous hepatic steatosis. No calcified gallstone or biliary dilatation. Pancreas: Unremarkable. No pancreatic ductal dilatation or surrounding inflammatory changes.  Spleen: Normal in size without focal abnormality. Adrenals/Urinary Tract: Adrenal glands are normal. Cortical scarring within the left kidney. Bilateral intrarenal stones. These measure up to 5 mm in the lower pole left kidney. 9 mm stone in the right renal pelvis. There is mild right hydronephrosis. Suspicion in urothelial thickening of the right renal pelvis. Right ureter is slightly dilated but no definitive ureteral stone. The bladder is nearly empty and is displaced to the right pelvis. Stomach/Bowel: Stomach is within normal limits. Appendix appears normal. No evidence of bowel wall thickening, distention, or inflammatory changes. Vascular/Lymphatic: Nonaneurysmal aorta. No suspicious adenopathy Reproductive: Markedly enlarged, uterus extends to above the umbilicus. Large heterogeneous uterine mass measuring at least 16.4 cm transverse by 18 cm AP by 19 cm craniocaudad. Internal low-attenuation areas possibly due to cystic degeneration. Tubular structure containing air paralleling the left uterine mass presumably represent displaced uterus from the exophytic fibroid. Bilateral tubal occlusion devices along the  anterior aspect of the mass. Other: Negative for free air or free fluid. Musculoskeletal: Degenerative changes of the spine. No acute or suspicious osseous abnormality IMPRESSION: 1. Markedly enlarged uterus with large heterogeneous uterine mass measuring up to 18 cm in diameter, suspected to represent a large exophytic fibroid. Tubular structure along the left aspect of the uterine mass, suspected to represent displaced uterus. There is air within the vagina and endometrial cavity. Air in the endometrium could be refluxed from the vagina, correlate for any recent manipulation. 2. Mild right hydronephrosis, secondary to a 9 mm stone in the right renal pelvis. There appears to be mild urothelial thickening of the right renal pelvis which could be secondary to infection or inflammation. Right ureter is also slightly dilated but no ureteral stone is seen. Suspect that there is a distal obstruction of right ureter by the large uterine mass. There are intrarenal stones bilaterally. 3. Heterogenous hepatic steatosis. 4. Small hiatal hernia. Thickened appearance at the GE junction. Consider correlation with endoscopy. Electronically Signed   By: Donavan Foil M.D.   On: 10/30/2019 16:09     Assessment / Plan:   Jennifer [name] is a 44 y.o. G4P0 who presents withFlank and abdominal pain with a large fibroid UTX. Possibility of degeneration fibroid as  the source of pain vs compression of ureters and non obstructing urolithiasis  Urine analysis c/w at least cystitis Admit to my service and continue IV Rocephin 1 gm q 12 hrs . Norco vs IV Morphine prn for pain relief I have briefly spoken to the patient about the role of TAH. I will evaluate and discuss more tomorrow.  I will speak to Dr Erlene Quan to see if there is anything that she may consider at the time of TAH ie stenting    Thank you for the opportunity to be involved with this pt's care.

## 2019-10-31 ENCOUNTER — Encounter: Payer: Self-pay | Admitting: Obstetrics and Gynecology

## 2019-10-31 ENCOUNTER — Other Ambulatory Visit: Payer: Self-pay

## 2019-10-31 DIAGNOSIS — N200 Calculus of kidney: Secondary | ICD-10-CM

## 2019-10-31 DIAGNOSIS — N390 Urinary tract infection, site not specified: Secondary | ICD-10-CM

## 2019-10-31 LAB — URINALYSIS, COMPLETE (UACMP) WITH MICROSCOPIC
Bilirubin Urine: NEGATIVE
Glucose, UA: NEGATIVE mg/dL
Ketones, ur: NEGATIVE mg/dL
Nitrite: NEGATIVE
Protein, ur: NEGATIVE mg/dL
Specific Gravity, Urine: 1.012 (ref 1.005–1.030)
WBC, UA: >50 WBC/hpf — ABNORMAL HIGH (ref 0–5)
pH: 6 (ref 5.0–8.0)

## 2019-10-31 LAB — CBC
HCT: 38.7 % (ref 36.0–46.0)
Hemoglobin: 13 g/dL (ref 12.0–15.0)
MCH: 32.3 pg (ref 26.0–34.0)
MCHC: 33.6 g/dL (ref 30.0–36.0)
MCV: 96 fL (ref 80.0–100.0)
Platelets: 264 10*3/uL (ref 150–400)
RBC: 4.03 MIL/uL (ref 3.87–5.11)
RDW: 14.9 % (ref 11.5–15.5)
WBC: 6.2 10*3/uL (ref 4.0–10.5)
nRBC: 0 % (ref 0.0–0.2)

## 2019-10-31 LAB — COMPREHENSIVE METABOLIC PANEL
ALT: 33 U/L (ref 0–44)
AST: 40 U/L (ref 15–41)
Albumin: 3.6 g/dL (ref 3.5–5.0)
Alkaline Phosphatase: 46 U/L (ref 38–126)
Anion gap: 5 (ref 5–15)
BUN: 15 mg/dL (ref 6–20)
CO2: 26 mmol/L (ref 22–32)
Calcium: 8.6 mg/dL — ABNORMAL LOW (ref 8.9–10.3)
Chloride: 107 mmol/L (ref 98–111)
Creatinine, Ser: 0.96 mg/dL (ref 0.44–1.00)
GFR calc Af Amer: >60 mL/min (ref >60)
GFR calc non Af Amer: >60 mL/min (ref >60)
Glucose, Bld: 99 mg/dL (ref 70–99)
Potassium: 3.8 mmol/L (ref 3.5–5.1)
Sodium: 138 mmol/L (ref 135–145)
Total Bilirubin: 0.5 mg/dL (ref 0.3–1.2)
Total Protein: 6.3 g/dL — ABNORMAL LOW (ref 6.5–8.1)

## 2019-10-31 LAB — URINE CULTURE

## 2019-10-31 MED ORDER — LACTATED RINGERS IV SOLN: INTRAVENOUS | Status: DC

## 2019-10-31 MED ORDER — ALPRAZOLAM 0.5 MG PO TABS
0.2500 mg | ORAL_TABLET | Freq: Two times a day (BID) | ORAL | Status: DC | PRN
  Administered 2019-11-01 – 2019-11-03 (×3): 0.25 mg via ORAL
  Filled 2019-10-31 (×3): qty 1

## 2019-10-31 MED ORDER — ESCITALOPRAM OXALATE 10 MG PO TABS
10.0000 mg | ORAL_TABLET | Freq: Every day | ORAL | Status: DC
  Filled 2019-10-31: qty 1

## 2019-10-31 MED ORDER — GABAPENTIN 600 MG PO TABS
300.0000 mg | ORAL_TABLET | Freq: Every day | ORAL | Status: DC
  Administered 2019-10-31: 22:00:00 300 mg via ORAL
  Filled 2019-10-31 (×2): qty 0.5

## 2019-10-31 MED ORDER — PANTOPRAZOLE SODIUM 40 MG PO TBEC
40.0000 mg | DELAYED_RELEASE_TABLET | Freq: Every day | ORAL | Status: DC
  Administered 2019-10-31 – 2019-11-03 (×3): 40 mg via ORAL
  Filled 2019-10-31 (×3): qty 1

## 2019-10-31 MED ORDER — DEXTROSE 5 % IV SOLN
3.0000 g | Freq: Once | INTRAVENOUS | Status: AC
  Administered 2019-11-01: 3 g via INTRAVENOUS
  Filled 2019-10-31: qty 3

## 2019-10-31 MED ORDER — ESCITALOPRAM OXALATE 10 MG PO TABS
20.0000 mg | ORAL_TABLET | Freq: Every day | ORAL | Status: DC
  Administered 2019-10-31 – 2019-11-03 (×4): 20 mg via ORAL
  Filled 2019-10-31 (×4): qty 2

## 2019-10-31 NOTE — Progress Notes (Signed)
Subjective: Patient reports stil some lower abd/ left flank pain . Required on Iv meds once for pain . Urology consult completed    Objective: I have reviewed patient's vital signs, medications and labs.  CV : RRR  Lungs CTA ABd: UTX U+4 cm mobile Mild left Flank TTP    Assessment/Plan: Large fibroid utx with probable ureteral compression  Add lexapro( normal takes) and xanax 0.25 mg bid prn anxiety  NPO at 2400  I spoke to her about the role of hysterectomy + bilateral salpingectomy  . Risks of the procedure including organ injury , DVT , infection and blood transfusion . All questions answered . Cont IVF   LOS: 1 day    Jennifer [name] 10/31/2019, 12:43 PM

## 2019-10-31 NOTE — Consult Note (Signed)
Urology Consult  I have been asked to see the patient by Dr. Ouida Sills, for evaluation and management of kidney stones/ UTI.  Chief Complaint:  Left low back pain  History of Present Illness: Jennifer [name] is a 44 y.o. year old female with a personal history of nephrolithiasis who presented to the emergency room with 3 to 4 weeks of severe left lower back pain.  She describes the pain is constant.  No worsening or alleviation related to time of day, position.  She describes the pain as severe and pressure-like.  Does not radiate down the back of her leg or to her left lower quadrant/abdomen.  She also describes some low pelvic pain in the midline which she describes her "bladder".  She denies any overt dysuria.  No gross hematuria.  CT stone protocol revealed an approximately 7 mm stone at the right UPJ with some edema/inflammatory change with fullness of the right collecting system without overt obstruction.  There is also very mild right hydroureter down to the level of the pelvis where there is a large uterine fibroid present.  Left kidney is a small nonobstructing stone without any obstructive change.  Labs all within normal limits.  UA mildly suspicious for infection with greater than 50 WBCs and RBCs, nitrate negative, minimal bacteria.  Culture pending.  She has been started on ceftriaxone presumed UTI.  She does have a personal history of kidney stones.  She was previously followed by Dr. Bernardo Heater in the remote past.  She said successful ESWL times multiple most recently as 2006 per her recollection.  She since lost health insurance and has not been seeing doctors for the most part.  She absolutely denies any right-sided flank pain.  No fevers or chills.  She is scheduled to undergo hysterectomy with Dr. Ouida Sills tomorrow for a large massive uterine fibroid presumably with mass-effect on the right ureter.   Past Medical History:  Diagnosis Date  . Depression   . Uterine  fibroid     History reviewed. No pertinent surgical history.  Home Medications:  Current Meds  Medication Sig  . cetirizine (ZYRTEC) 10 MG tablet Take 10 mg by mouth daily.  Marland Kitchen escitalopram (LEXAPRO) 10 MG tablet Take 1 tablet (10 mg total) by mouth daily. (Patient taking differently: Take 20 mg by mouth at bedtime. )  . naproxen (NAPROSYN) 500 MG tablet Take 1 tablet (500 mg total) by mouth 2 (two) times daily with a meal. (Patient taking differently: Take 500 mg by mouth daily. )    Allergies:  Allergies  Allergen Reactions  . Amoxicillin Swelling  . Hydrocodone Other (See Comments)    headache    History reviewed. No pertinent family history.  Social History:  reports that she has quit smoking. Her smoking use included cigarettes. She smoked 0.50 packs per day. She has never used smokeless tobacco. She reports that she does not drink alcohol or use drugs.  ROS: A complete review of systems was performed.  All systems are negative except for pertinent findings as noted.  Physical Exam:  Vital signs in last 24 hours: Temp:  [97.7 F (36.5 C)-98.2 F (36.8 C)] 97.7 F (36.5 C) (05/06 0809) Pulse Rate:  [60-96] 60 (05/06 0809) Resp:  [16-20] 18 (05/06 0809) BP: (113-142)/(56-69) 139/61 (05/06 0809) SpO2:  [94 %-96 %] 96 % (05/06 0809) Weight:  [108.9 kg] 108.9 kg (05/05 1157) Constitutional:  Alert and oriented, No acute distress HEENT: [place] AT, moist mucus membranes.  Trachea midline, no masses Cardiovascular: Regular rate and rhythm, no clubbing, cyanosis, or edema. Respiratory: Normal respiratory effort, lungs clear bilaterally GI: Abdomen is soft, palpable uterus in the pelvis.  No overt CVA tenderness bilaterally. Neurologic: Grossly intact, no focal deficits, moving all 4 extremities Psychiatric: Normal mood and affect   Laboratory Data:  Recent Labs    10/30/19 1202 10/31/19 0540  WBC 7.1 6.2  HGB 13.4 13.0  HCT 41.2 38.7   Recent Labs    10/30/19 1202  10/31/19 0540  NA 137 138  K 3.8 3.8  CL 103 107  CO2 25 26  GLUCOSE 110* 99  BUN 20 15  CREATININE 1.04* 0.96  CALCIUM 8.5* 8.6*   No results for input(s): LABPT, INR in the last 72 hours. No results for input(s): LABURIN in the last 72 hours. Results for orders placed or performed during the hospital encounter of 10/30/19  Respiratory Panel by RT PCR (Flu A&B, Covid) - Nasopharyngeal Swab     Status: None   Collection Time: 10/30/19  7:33 PM   Specimen: Nasopharyngeal Swab  Result Value Ref Range Status   SARS Coronavirus 2 by RT PCR NEGATIVE NEGATIVE Final    Comment: (NOTE) SARS-CoV-2 target nucleic acids are NOT DETECTED. The SARS-CoV-2 RNA is generally detectable in upper respiratoy specimens during the acute phase of infection. The lowest concentration of SARS-CoV-2 viral copies this assay can detect is 131 copies/mL. A negative result does not preclude SARS-Cov-2 infection and should not be used as the sole basis for treatment or other patient management decisions. A negative result may occur with  improper specimen collection/handling, submission of specimen other than nasopharyngeal swab, presence of viral mutation(s) within the areas targeted by this assay, and inadequate number of viral copies (<131 copies/mL). A negative result must be combined with clinical observations, patient history, and epidemiological information. The expected result is Negative. Fact Sheet for Patients:  PinkCheek.be Fact Sheet for Healthcare Providers:  GravelBags.it This test is not yet ap proved or cleared by the [place] FDA and  has been authorized for detection and/or diagnosis of SARS-CoV-2 by FDA under an Emergency Use Authorization (EUA). This EUA will remain  in effect (meaning this test can be used) for the duration of the COVID-19 declaration under Section 564(b)(1) of the Act, 21 U.S.C. section 360bbb-3(b)(1),  unless the authorization is terminated or revoked sooner.    Influenza A by PCR NEGATIVE NEGATIVE Final   Influenza B by PCR NEGATIVE NEGATIVE Final    Comment: (NOTE) The Xpert Xpress SARS-CoV-2/FLU/RSV assay is intended as an aid in  the diagnosis of influenza from Nasopharyngeal swab specimens and  should not be used as a sole basis for treatment. Nasal washings and  aspirates are unacceptable for Xpert Xpress SARS-CoV-2/FLU/RSV  testing. Fact Sheet for Patients: PinkCheek.be Fact Sheet for Healthcare Providers: GravelBags.it This test is not yet approved or cleared by the [place] FDA and  has been authorized for detection and/or diagnosis of SARS-CoV-2 by  FDA under an Emergency Use Authorization (EUA). This EUA will remain  in effect (meaning this test can be used) for the duration of the  Covid-19 declaration under Section 564(b)(1) of the Act, 21  U.S.C. section 360bbb-3(b)(1), unless the authorization is  terminated or revoked. Performed at [facility], [address] [place], [place] [address]      Radiologic Imaging: CT Renal Stone Study  Result Date: 10/30/2019 CLINICAL DATA:  Left flank pain EXAM: CT ABDOMEN AND  PELVIS WITHOUT CONTRAST TECHNIQUE: Multidetector CT imaging of the abdomen and pelvis was performed following the standard protocol without IV contrast. COMPARISON:  CT 06/12/2009, pelvic ultrasound 03/17/2017 FINDINGS: Lower chest: Lung bases demonstrate no acute consolidation or pleural effusion. Small hiatal hernia. Thickened appearance at the GE junction. Hepatobiliary: Heterogenous hepatic steatosis. No calcified gallstone or biliary dilatation. Pancreas: Unremarkable. No pancreatic ductal dilatation or surrounding inflammatory changes. Spleen: Normal in size without focal abnormality. Adrenals/Urinary Tract: Adrenal glands are normal. Cortical scarring within the left kidney. Bilateral  intrarenal stones. These measure up to 5 mm in the lower pole left kidney. 9 mm stone in the right renal pelvis. There is mild right hydronephrosis. Suspicion in urothelial thickening of the right renal pelvis. Right ureter is slightly dilated but no definitive ureteral stone. The bladder is nearly empty and is displaced to the right pelvis. Stomach/Bowel: Stomach is within normal limits. Appendix appears normal. No evidence of bowel wall thickening, distention, or inflammatory changes. Vascular/Lymphatic: Nonaneurysmal aorta. No suspicious adenopathy Reproductive: Markedly enlarged, uterus extends to above the umbilicus. Large heterogeneous uterine mass measuring at least 16.4 cm transverse by 18 cm AP by 19 cm craniocaudad. Internal low-attenuation areas possibly due to cystic degeneration. Tubular structure containing air paralleling the left uterine mass presumably represent displaced uterus from the exophytic fibroid. Bilateral tubal occlusion devices along the anterior aspect of the mass. Other: Negative for free air or free fluid. Musculoskeletal: Degenerative changes of the spine. No acute or suspicious osseous abnormality IMPRESSION: 1. Markedly enlarged uterus with large heterogeneous uterine mass measuring up to 18 cm in diameter, suspected to represent a large exophytic fibroid. Tubular structure along the left aspect of the uterine mass, suspected to represent displaced uterus. There is air within the vagina and endometrial cavity. Air in the endometrium could be refluxed from the vagina, correlate for any recent manipulation. 2. Mild right hydronephrosis, secondary to a 9 mm stone in the right renal pelvis. There appears to be mild urothelial thickening of the right renal pelvis which could be secondary to infection or inflammation. Right ureter is also slightly dilated but no ureteral stone is seen. Suspect that there is a distal obstruction of right ureter by the large uterine mass. There are  intrarenal stones bilaterally. 3. Heterogenous hepatic steatosis. 4. Small hiatal hernia. Thickened appearance at the GE junction. Consider correlation with endoscopy. Electronically Signed   By: Donavan Foil M.D.   On: 10/30/2019 16:09    Impression/plan:  1.  UTI-agree with antibiotics, currently on ceftriaxone.  Adjust based on culture and sensitivity data.  She has had a suspicious urine several months ago but no culture was sent unfortunately.  UA may also be consistent with known stone at the UPJ with inflammatory change rather than true infection.  Awaiting culture.  2.  Left "flank" pain-patient localizes pain over her left iliac crest other than her true flank.  The nature of her pain is also not exactly consistent with renal pain as it does not radiate to traditional location.  In addition to this, she has no significant left-sided pathology or stone burden without any obstructive stones or obstructive changes on the left specifically.  I do not feel that her pain is GU in origin.   3.  Right UPJ stone-suspect this is likely been ball valving chronically at this location or kidney and will need to be addressed in the near future.  There is no significant hydronephrosis on the side this I think it is likely intermittently  obstructing.  No systemic sign of infection necessitating urgent urinary decompression.  She is done well with ESWL in the past and she would benefit from this as an outpatient.  We discussed the option of a ureteral stent but given that she is completely asymptomatic on the right, this would necessitate a second procedure thus she would like to hold off in order to avoid complications/stent pain.    4.  Right hydroureter-likely secondary to obstructing uterine fibroid.  Being addressed tomorrow with Dr. Minna Merritts.  Offered ureteral stents for ureteral identification but based on his approach, he does not feel these would be necessary.  As such, we will hold off on  ureteral stenting.  Please call us with any questions or concerns.  10/31/2019, 11:04 AM  Hollice Espy,  MD

## 2019-11-01 ENCOUNTER — Encounter
Admission: EM | Disposition: A | Discharge status: Home / Self Care | Payer: Self-pay | Source: Home / Self Care | Attending: Obstetrics and Gynecology

## 2019-11-01 ENCOUNTER — Inpatient Hospital Stay: Payer: Self-pay | Admitting: Anesthesiology

## 2019-11-01 ENCOUNTER — Encounter: Payer: Self-pay | Admitting: Obstetrics and Gynecology

## 2019-11-01 DIAGNOSIS — Z9889 Other specified postprocedural states: Secondary | ICD-10-CM

## 2019-11-01 HISTORY — PX: CYSTOSCOPY: SHX5120

## 2019-11-01 HISTORY — PX: HYSTERECTOMY ABDOMINAL WITH SALPINGO-OOPHORECTOMY: SHX6792

## 2019-11-01 LAB — ABO/RH: ABO/RH(D): O NEG

## 2019-11-01 SURGERY — HYSTERECTOMY, ABDOMINAL, WITH SALPINGO-OOPHORECTOMY
Anesthesia: General | Laterality: Bilateral

## 2019-11-01 MED ORDER — BUPIVACAINE HCL (PF) 0.5 % IJ SOLN
INTRAMUSCULAR | Status: AC
  Filled 2019-11-01: qty 30

## 2019-11-01 MED ORDER — LIDOCAINE HCL (CARDIAC) PF 100 MG/5ML IV SOSY
PREFILLED_SYRINGE | INTRAVENOUS | Status: DC | PRN
  Administered 2019-11-01: 100 mg via INTRAVENOUS

## 2019-11-01 MED ORDER — IBUPROFEN 800 MG PO TABS: 800.0000 mg | ORAL_TABLET | Freq: Four times a day (QID) | ORAL | Status: DC

## 2019-11-01 MED ORDER — SODIUM CHLORIDE (PF) 0.9 % IJ SOLN
INTRAMUSCULAR | Status: AC
  Filled 2019-11-01: qty 50

## 2019-11-01 MED ORDER — PROPOFOL 10 MG/ML IV BOLUS
INTRAVENOUS | Status: AC
  Filled 2019-11-01: qty 20

## 2019-11-01 MED ORDER — ACETAMINOPHEN 10 MG/ML IV SOLN
INTRAVENOUS | Status: DC | PRN
  Administered 2019-11-01: 1000 mg via INTRAVENOUS

## 2019-11-01 MED ORDER — NALOXONE HCL 0.4 MG/ML IJ SOLN
0.4000 mg | INTRAMUSCULAR | Status: DC | PRN
  Filled 2019-11-01: qty 1

## 2019-11-01 MED ORDER — BUPIVACAINE HCL 0.5 % IJ SOLN
INTRAMUSCULAR | Status: DC | PRN
  Administered 2019-11-01: 25 mL

## 2019-11-01 MED ORDER — ONDANSETRON HCL 4 MG PO TABS: 4.0000 mg | ORAL_TABLET | Freq: Four times a day (QID) | ORAL | Status: DC | PRN

## 2019-11-01 MED ORDER — FENTANYL CITRATE (PF) 100 MCG/2ML IJ SOLN
INTRAMUSCULAR | Status: AC
  Administered 2019-11-01: 16:00:00 25 ug via INTRAVENOUS
  Filled 2019-11-01: qty 2

## 2019-11-01 MED ORDER — LIDOCAINE HCL (PF) 2 % IJ SOLN
INTRAMUSCULAR | Status: AC
  Filled 2019-11-01: qty 5

## 2019-11-01 MED ORDER — BUPIVACAINE LIPOSOME 1.3 % IJ SUSP
INTRAMUSCULAR | Status: AC
  Filled 2019-11-01: qty 20

## 2019-11-01 MED ORDER — ROCURONIUM BROMIDE 10 MG/ML (PF) SYRINGE
PREFILLED_SYRINGE | INTRAVENOUS | Status: AC
  Filled 2019-11-01: qty 10

## 2019-11-01 MED ORDER — KETOROLAC TROMETHAMINE 30 MG/ML IJ SOLN
INTRAMUSCULAR | Status: DC | PRN
  Administered 2019-11-01: 30 mg via INTRAVENOUS

## 2019-11-01 MED ORDER — ROCURONIUM BROMIDE 100 MG/10ML IV SOLN
INTRAVENOUS | Status: DC | PRN
  Administered 2019-11-01: 5 mg via INTRAVENOUS
  Administered 2019-11-01 (×4): 20 mg via INTRAVENOUS
  Administered 2019-11-01: 50 mg via INTRAVENOUS
  Administered 2019-11-01: 20 mg via INTRAVENOUS

## 2019-11-01 MED ORDER — DIPHENHYDRAMINE HCL 12.5 MG/5ML PO ELIX
12.5000 mg | ORAL_SOLUTION | Freq: Four times a day (QID) | ORAL | Status: DC | PRN
  Filled 2019-11-01: qty 5

## 2019-11-01 MED ORDER — SODIUM CHLORIDE FLUSH 0.9 % IV SOLN
INTRAVENOUS | Status: AC
  Filled 2019-11-01: qty 10

## 2019-11-01 MED ORDER — PROMETHAZINE HCL 25 MG/ML IJ SOLN: 6.2500 mg | INTRAMUSCULAR | Status: DC | PRN

## 2019-11-01 MED ORDER — FENTANYL CITRATE (PF) 100 MCG/2ML IJ SOLN
INTRAMUSCULAR | Status: DC | PRN
  Administered 2019-11-01: 100 ug via INTRAVENOUS
  Administered 2019-11-01 (×2): 50 ug via INTRAVENOUS

## 2019-11-01 MED ORDER — FLUORESCEIN SODIUM 10 % IV SOLN
INTRAVENOUS | Status: DC | PRN
  Administered 2019-11-01: .5 mL via INTRAVENOUS

## 2019-11-01 MED ORDER — DIPHENHYDRAMINE HCL 50 MG/ML IJ SOLN: 12.5000 mg | Freq: Four times a day (QID) | INTRAMUSCULAR | Status: DC | PRN

## 2019-11-01 MED ORDER — PROPOFOL 10 MG/ML IV BOLUS
INTRAVENOUS | Status: DC | PRN
  Administered 2019-11-01: 200 mg via INTRAVENOUS
  Administered 2019-11-01: 40 mg via INTRAVENOUS

## 2019-11-01 MED ORDER — LACTATED RINGERS IV SOLN: INTRAVENOUS | Status: DC

## 2019-11-01 MED ORDER — ACETAMINOPHEN 500 MG PO TABS
1000.0000 mg | ORAL_TABLET | Freq: Four times a day (QID) | ORAL | Status: DC
  Administered 2019-11-01 – 2019-11-03 (×7): 1000 mg via ORAL
  Filled 2019-11-01 (×9): qty 2

## 2019-11-01 MED ORDER — ONDANSETRON HCL 4 MG/2ML IJ SOLN
INTRAMUSCULAR | Status: AC
  Filled 2019-11-01: qty 2

## 2019-11-01 MED ORDER — ONDANSETRON HCL 4 MG/2ML IJ SOLN
INTRAMUSCULAR | Status: DC | PRN
  Administered 2019-11-01: 4 mg via INTRAVENOUS

## 2019-11-01 MED ORDER — DEXAMETHASONE SODIUM PHOSPHATE 10 MG/ML IJ SOLN
INTRAMUSCULAR | Status: DC | PRN
  Administered 2019-11-01: 10 mg via INTRAVENOUS

## 2019-11-01 MED ORDER — LACTATED RINGERS IV SOLN: INTRAVENOUS | Status: DC | PRN

## 2019-11-01 MED ORDER — DEXAMETHASONE SODIUM PHOSPHATE 10 MG/ML IJ SOLN
INTRAMUSCULAR | Status: AC
  Filled 2019-11-01: qty 1

## 2019-11-01 MED ORDER — SODIUM CHLORIDE 0.9 % IV SOLN
INTRAVENOUS | Status: DC | PRN
  Administered 2019-11-01: 15:00:00 25 mL

## 2019-11-01 MED ORDER — FLUORESCEIN SODIUM 10 % IV SOLN
INTRAVENOUS | Status: AC
  Filled 2019-11-01: qty 5

## 2019-11-01 MED ORDER — ACETAMINOPHEN NICU IV SYRINGE 10 MG/ML
INTRAVENOUS | Status: AC
  Filled 2019-11-01: qty 1

## 2019-11-01 MED ORDER — KETOROLAC TROMETHAMINE 30 MG/ML IJ SOLN
30.0000 mg | Freq: Four times a day (QID) | INTRAMUSCULAR | Status: AC
  Administered 2019-11-01 – 2019-11-02 (×3): 30 mg via INTRAVENOUS
  Filled 2019-11-01 (×3): qty 1

## 2019-11-01 MED ORDER — PROMETHAZINE HCL 25 MG/ML IJ SOLN
INTRAMUSCULAR | Status: AC
  Administered 2019-11-01: 16:00:00 6.25 mg via INTRAVENOUS
  Filled 2019-11-01: qty 1

## 2019-11-01 MED ORDER — SUGAMMADEX SODIUM 200 MG/2ML IV SOLN
INTRAVENOUS | Status: DC | PRN
  Administered 2019-11-01: 200 mg via INTRAVENOUS

## 2019-11-01 MED ORDER — PHENYLEPHRINE HCL (PRESSORS) 10 MG/ML IV SOLN
INTRAVENOUS | Status: DC | PRN
  Administered 2019-11-01: 100 ug via INTRAVENOUS

## 2019-11-01 MED ORDER — MIDAZOLAM HCL 2 MG/2ML IJ SOLN
INTRAMUSCULAR | Status: AC
  Filled 2019-11-01: qty 2

## 2019-11-01 MED ORDER — MORPHINE SULFATE 2 MG/ML IV SOLN
INTRAVENOUS | Status: AC
  Administered 2019-11-01: 16.74 mg via INTRAVENOUS
  Administered 2019-11-02: 13.86 mg via INTRAVENOUS
  Administered 2019-11-02: 17.86 mg via INTRAVENOUS
  Administered 2019-11-02: 2.5 mL via INTRAVENOUS
  Administered 2019-11-02: 2.53 mL via INTRAVENOUS
  Filled 2019-11-01: qty 30

## 2019-11-01 MED ORDER — GABAPENTIN 300 MG PO CAPS
300.0000 mg | ORAL_CAPSULE | Freq: Every day | ORAL | Status: DC
  Administered 2019-11-01: 21:00:00 300 mg via ORAL
  Filled 2019-11-01: qty 1

## 2019-11-01 MED ORDER — FENTANYL CITRATE (PF) 100 MCG/2ML IJ SOLN
25.0000 ug | INTRAMUSCULAR | Status: DC | PRN
  Administered 2019-11-01 (×4): 25 ug via INTRAVENOUS

## 2019-11-01 MED ORDER — SODIUM CHLORIDE 0.9% FLUSH: 9.0000 mL | INTRAVENOUS | Status: DC | PRN

## 2019-11-01 MED ORDER — FENTANYL CITRATE (PF) 100 MCG/2ML IJ SOLN
INTRAMUSCULAR | Status: AC
  Filled 2019-11-01: qty 2

## 2019-11-01 MED ORDER — SEVOFLURANE IN SOLN
RESPIRATORY_TRACT | Status: AC
  Filled 2019-11-01: qty 250

## 2019-11-01 MED ORDER — KETOROLAC TROMETHAMINE 30 MG/ML IJ SOLN
30.0000 mg | Freq: Four times a day (QID) | INTRAMUSCULAR | Status: DC
  Filled 2019-11-01: qty 1

## 2019-11-01 MED ORDER — ONDANSETRON HCL 4 MG/2ML IJ SOLN: 4.0000 mg | Freq: Four times a day (QID) | INTRAMUSCULAR | Status: DC | PRN

## 2019-11-01 MED ORDER — IBUPROFEN 800 MG PO TABS
800.0000 mg | ORAL_TABLET | Freq: Four times a day (QID) | ORAL | Status: DC
  Administered 2019-11-02 – 2019-11-03 (×4): 800 mg via ORAL
  Filled 2019-11-01 (×4): qty 1

## 2019-11-01 MED ORDER — MIDAZOLAM HCL 2 MG/2ML IJ SOLN
INTRAMUSCULAR | Status: DC | PRN
  Administered 2019-11-01: 2 mg via INTRAVENOUS

## 2019-11-01 MED ORDER — ONDANSETRON HCL 4 MG/2ML IJ SOLN
4.0000 mg | Freq: Four times a day (QID) | INTRAMUSCULAR | Status: DC | PRN
  Filled 2019-11-01: qty 2

## 2019-11-01 SURGICAL SUPPLY — 47 items
CANISTER SUCT 1200ML W/VALVE (MISCELLANEOUS) ×4 IMPLANT
CHLORAPREP W/TINT 26 (MISCELLANEOUS) ×4 IMPLANT
COVER WAND RF STERILE (DRAPES) ×4 IMPLANT
DRAPE LAP W/FLUID (DRAPES) ×4 IMPLANT
DRAPE UNDER BUTTOCK W/FLU (DRAPES) ×4 IMPLANT
DRSG TELFA 3X8 NADH (GAUZE/BANDAGES/DRESSINGS) ×4 IMPLANT
ELECT BLADE 6.5 EXT (BLADE) ×2 IMPLANT
ELECT REM PT RETURN 9FT ADLT (ELECTROSURGICAL) ×4
ELECTRODE REM PT RTRN 9FT ADLT (ELECTROSURGICAL) ×2 IMPLANT
GAUZE 4X4 16PLY RFD (DISPOSABLE) ×4 IMPLANT
GAUZE SPONGE 4X4 12PLY STRL (GAUZE/BANDAGES/DRESSINGS) ×4 IMPLANT
GLOVE BIO SURGEON STRL SZ7 (GLOVE) ×6 IMPLANT
GLOVE BIOGEL PI IND STRL 6.5 (GLOVE) ×2 IMPLANT
GLOVE BIOGEL PI INDICATOR 6.5 (GLOVE) ×4
GLOVE SURG SYN 8.0 (GLOVE) ×12 IMPLANT
GLOVE SURG SYN 8.0 PF PI (GLOVE) ×2 IMPLANT
GOWN STRL REUS W/ TWL LRG LVL3 (GOWN DISPOSABLE) ×4 IMPLANT
GOWN STRL REUS W/ TWL XL LVL3 (GOWN DISPOSABLE) ×2 IMPLANT
GOWN STRL REUS W/TWL LRG LVL3 (GOWN DISPOSABLE) ×8
GOWN STRL REUS W/TWL XL LVL3 (GOWN DISPOSABLE) ×2
KIT TURNOVER CYSTO (KITS) ×4 IMPLANT
LABEL OR SOLS (LABEL) ×4 IMPLANT
NEEDLE HYPO 22GX1.5 SAFETY (NEEDLE) ×8 IMPLANT
PACK BASIN MAJOR (MISCELLANEOUS) ×4 IMPLANT
PAD DRESSING TELFA 3X8 NADH (GAUZE/BANDAGES/DRESSINGS) ×2 IMPLANT
PENCIL SMOKE ULTRAEVAC 22 CON (MISCELLANEOUS) ×2 IMPLANT
RETAINER VISCERA MED (MISCELLANEOUS) IMPLANT
RETRACTOR WND ALEXIS-O 25 LRG (MISCELLANEOUS) IMPLANT
RTRCTR WOUND ALEXIS O 25CM LRG (MISCELLANEOUS) ×4
SET CYSTO W/LG BORE CLAMP LF (SET/KITS/TRAYS/PACK) ×4 IMPLANT
SOL PREP PVP 2OZ (MISCELLANEOUS) ×4
SOLUTION PREP PVP 2OZ (MISCELLANEOUS) ×2 IMPLANT
STAPLER INSORB 30 2030 C-SECTI (MISCELLANEOUS) ×2 IMPLANT
STAPLER SKIN PROX 35W (STAPLE) IMPLANT
SURGILUBE 2OZ TUBE FLIPTOP (MISCELLANEOUS) ×4 IMPLANT
SUT PDS AB 1 TP1 96 (SUTURE) IMPLANT
SUT VIC AB 0 CT1 27 (SUTURE) ×8
SUT VIC AB 0 CT1 27XCR 8 STRN (SUTURE) ×4 IMPLANT
SUT VIC AB 0 CT1 36 (SUTURE) ×8 IMPLANT
SUT VIC AB 2-0 SH 27 (SUTURE) ×8
SUT VIC AB 2-0 SH 27XBRD (SUTURE) ×8 IMPLANT
SUT VICRYL PLUS ABS 0 54 (SUTURE) ×4 IMPLANT
SYR 20ML LL LF (SYRINGE) ×8 IMPLANT
SYR 30ML LL (SYRINGE) ×4 IMPLANT
SYR BULB IRRIG 60ML STRL (SYRINGE) ×4 IMPLANT
TRAY FOLEY MTR SLVR 16FR STAT (SET/KITS/TRAYS/PACK) ×4 IMPLANT
WATER STERILE IRR 1000ML POUR (IV SOLUTION) ×4 IMPLANT

## 2019-11-01 NOTE — Anesthesia Postprocedure Evaluation (Signed)
Anesthesia Post Note  Patient: Production assistant, radio  Procedure(s) Performed: HYSTERECTOMY ABDOMINAL WITH SALPINGECTOMY (Bilateral ) CYSTOSCOPY  Patient location during evaluation: PACU Anesthesia Type: General Level of consciousness: awake and alert Pain management: pain level controlled Vital Signs Assessment: post-procedure vital signs reviewed and stable Respiratory status: spontaneous breathing, nonlabored ventilation, respiratory function stable and patient connected to nasal cannula oxygen Cardiovascular status: blood pressure returned to baseline and stable Postop Assessment: no apparent nausea or vomiting Anesthetic complications: no     Last Vitals:  Vitals:   11/01/19 1649 11/01/19 1720  BP: (!) 144/67 126/70  Pulse: 67 70  Resp: 18 20  Temp:    SpO2: 98% 99%    Last Pain:  Vitals:   11/01/19 1640  TempSrc:   PainSc: Asleep                 Precious Haws Joyce Leckey

## 2019-11-01 NOTE — Transfer of Care (Signed)
Immediate Anesthesia Transfer of Care Note  Patient: Jennifer [name]  Procedure(s) Performed: HYSTERECTOMY ABDOMINAL WITH SALPINGECTOMY (Bilateral ) CYSTOSCOPY  Patient Location: PACU  Anesthesia Type:General  Level of Consciousness: awake and drowsy  Airway & Oxygen Therapy: Patient Spontanous Breathing and Patient connected to face mask oxygen  Post-op Assessment: Report given to RN and Post -op Vital signs reviewed and stable  Post vital signs: Reviewed and stable  Last Vitals:  Vitals Value Taken Time  BP 146/66 11/01/19 1536  Temp    Pulse 70 11/01/19 1536  Resp 26 11/01/19 1536  SpO2 98 % 11/01/19 1536  Vitals shown include unvalidated device data.  Last Pain:  Vitals:   11/01/19 1009  TempSrc: Temporal  PainSc: 0-No pain         Complications: No apparent anesthesia complications

## 2019-11-01 NOTE — Progress Notes (Signed)
Patient awakes from sleep complaining of pain but she has received 125 Fentanyl and 6.25 of Phenergan. I give medicine and she falls asleep in the middle of a sentence and her oxygen saturations drop into the 80s. I feel that it is unsafe to continue to give pain medicines under this circumstances. I will call the floor and advise the relief nurse of the situation.

## 2019-11-01 NOTE — Anesthesia Procedure Notes (Addendum)
Date/Time: 11/01/2019 11:51 AM Performed by: Johnna Acosta, CRNA Pre-anesthesia Checklist: Patient identified, Emergency Drugs available, Suction available, Patient being monitored and Timeout performed Patient Re-evaluated:Patient Re-evaluated prior to induction Oxygen Delivery Method: Circle system utilized Preoxygenation: Pre-oxygenation with 100% oxygen Induction Type: IV induction Ventilation: Mask ventilation without difficulty and Oral airway inserted - appropriate to patient size Laryngoscope Size: McGraph and 3 Grade View: Grade II Tube type: Oral Tube size: 7.0 mm Number of attempts: 1 Airway Equipment and Method: Stylet and Video-laryngoscopy Placement Confirmation: ETT inserted through vocal cords under direct vision and breath sounds checked- equal and bilateral Secured at: 21 cm Tube secured with: Tape Dental Injury: Teeth and Oropharynx as per pre-operative assessment

## 2019-11-01 NOTE — Anesthesia Preprocedure Evaluation (Signed)
Anesthesia Evaluation  Patient identified by MRN, date of birth, ID band Patient awake    Reviewed: Allergy & Precautions, H&P , NPO status , Patient's Chart, lab work & pertinent test results, reviewed documented beta blocker date and time   History of Anesthesia Complications Negative for: history of anesthetic complications  Airway Mallampati: III  TM Distance: >3 FB Neck ROM: full    Dental  (+) Dental Advidsory Given, Teeth Intact   Pulmonary Current Smoker and Patient abstained from smoking.,    Pulmonary exam normal breath sounds clear to auscultation       Cardiovascular Exercise Tolerance: Good negative cardio ROS Normal cardiovascular exam Rhythm:regular Rate:Normal     Neuro/Psych negative neurological ROS  negative psych ROS   GI/Hepatic Neg liver ROS, GERD  ,  Endo/Other  neg diabetesMorbid obesity  Renal/GU Renal disease (kidney stones)  negative genitourinary   Musculoskeletal   Abdominal   Peds  Hematology negative hematology ROS (+)   Anesthesia Other Findings Past Medical History: No date: Depression No date: Uterine fibroid   Reproductive/Obstetrics negative OB ROS                             Anesthesia Physical Anesthesia Plan  ASA: III  Anesthesia Plan: General   Post-op Pain Management:    Induction: Intravenous  PONV Risk Score and Plan: 3 and Ondansetron, Dexamethasone, Midazolam, Promethazine and Treatment may vary due to age or medical condition  Airway Management Planned: Oral ETT  Additional Equipment:   Intra-op Plan:   Post-operative Plan: Extubation in OR  Informed Consent: I have reviewed the patients History and Physical, chart, labs and discussed the procedure including the risks, benefits and alternatives for the proposed anesthesia with the patient or authorized representative who has indicated his/her understanding and acceptance.      Dental Advisory Given  Plan Discussed with: Anesthesiologist, CRNA and Surgeon  Anesthesia Plan Comments:         Anesthesia Quick Evaluation

## 2019-11-01 NOTE — Progress Notes (Signed)
Pt is ready for TAh / bilateral salpingectomy . Labs reviewed . NPO.  All questions answered. Proceed with surgery

## 2019-11-01 NOTE — Progress Notes (Signed)
Patient to OR via bed and Orderly.  ABX on chart

## 2019-11-01 NOTE — Brief Op Note (Signed)
10/30/2019 - 11/01/2019  3:25 PM  PATIENT:  Jennifer [name]  44 y.o. female  PRE-OPERATIVE DIAGNOSIS:  Symptomatic fibroid POST-OPERATIVE DIAGNOSIS:  Symptomatic fibroid Left endometrioma  PROCEDURE:  Procedure(s): HYSTERECTOMY ABDOMINAL WITH SALPINGECTOMY (Bilateral) CYSTOSCOPY Left ovarian cystectomy  SURGEON:  Surgeon(s) and Role:    * Audreena Sachdeva, Gwen Her, MD - Primary    * Benjaman Kindler, MD - Assisting  PHYSICIAN ASSISTANT:   ASSISTANTS: none   ANESTHESIA:   general  EBL:  900 mL  IOF 2200 cc uo 300 cc  BLOOD ADMINISTERED:none  DRAINS: Urinary Catheter (Foley)   LOCAL MEDICATIONS USED:  MARCAINE    and BUPIVICAINE   SPECIMEN:  Source of Specimen:  cervix , uterus , bilateral fallopian tubes and left ovarian cyst   DISPOSITION OF SPECIMEN:  PATHOLOGY  COUNTS:  YES  TOURNIQUET:  * No tourniquets in log *  DICTATION: .Other Dictation: Dictation Number verbal  PLAN OF CARE: Admit to inpatient   PATIENT DISPOSITION:  PACU - hemodynamically stable.   Delay start of Pharmacological VTE agent (>24hrs) due to surgical blood loss or risk of bleeding:

## 2019-11-02 LAB — BPAM RBC
Blood Product Expiration Date: 202105132359
Blood Product Expiration Date: 202105282359
ISSUE DATE / TIME: 202104230955
ISSUE DATE / TIME: 202105050650
Unit Type and Rh: 9500
Unit Type and Rh: 9500

## 2019-11-02 LAB — CBC
HCT: 34.9 % — ABNORMAL LOW (ref 36.0–46.0)
Hemoglobin: 11.5 g/dL — ABNORMAL LOW (ref 12.0–15.0)
MCH: 32 pg (ref 26.0–34.0)
MCHC: 33 g/dL (ref 30.0–36.0)
MCV: 97.2 fL (ref 80.0–100.0)
Platelets: 325 10*3/uL (ref 150–400)
RBC: 3.59 MIL/uL — ABNORMAL LOW (ref 3.87–5.11)
RDW: 15.1 % (ref 11.5–15.5)
WBC: 15.2 10*3/uL — ABNORMAL HIGH (ref 4.0–10.5)
nRBC: 0 % (ref 0.0–0.2)

## 2019-11-02 LAB — COMPREHENSIVE METABOLIC PANEL
ALT: 31 U/L (ref 0–44)
AST: 32 U/L (ref 15–41)
Albumin: 3.5 g/dL (ref 3.5–5.0)
Alkaline Phosphatase: 40 U/L (ref 38–126)
Anion gap: 8 (ref 5–15)
BUN: 12 mg/dL (ref 6–20)
CO2: 26 mmol/L (ref 22–32)
Calcium: 8.2 mg/dL — ABNORMAL LOW (ref 8.9–10.3)
Chloride: 106 mmol/L (ref 98–111)
Creatinine, Ser: 0.85 mg/dL (ref 0.44–1.00)
GFR calc Af Amer: >60 mL/min (ref >60)
GFR calc non Af Amer: >60 mL/min (ref >60)
Glucose, Bld: 118 mg/dL — ABNORMAL HIGH (ref 70–99)
Potassium: 4 mmol/L (ref 3.5–5.1)
Sodium: 140 mmol/L (ref 135–145)
Total Bilirubin: 0.5 mg/dL (ref 0.3–1.2)
Total Protein: 6.6 g/dL (ref 6.5–8.1)

## 2019-11-02 LAB — PREPARE RBC (CROSSMATCH)

## 2019-11-02 LAB — TYPE AND SCREEN
ABO/RH(D): O NEG
Antibody Screen: NEGATIVE
Unit division: 0
Unit division: 0

## 2019-11-02 MED ORDER — MORPHINE SULFATE (PF) 2 MG/ML IV SOLN: 2.0000 mg | INTRAVENOUS | Status: DC | PRN

## 2019-11-02 MED ORDER — OXYCODONE HCL 5 MG PO TABS
10.0000 mg | ORAL_TABLET | ORAL | Status: DC | PRN
  Administered 2019-11-02 – 2019-11-03 (×4): 10 mg via ORAL
  Filled 2019-11-02 (×4): qty 2

## 2019-11-02 MED ORDER — OXYCODONE-ACETAMINOPHEN 5-325 MG PO TABS
1.0000 | ORAL_TABLET | ORAL | Status: DC | PRN
Start: 2019-11-02 — End: 2019-11-02

## 2019-11-02 MED ORDER — SODIUM CHLORIDE 0.9 % IV SOLN
INTRAVENOUS | Status: DC | PRN
  Administered 2019-11-02: 500 mL via INTRAVENOUS

## 2019-11-02 MED ORDER — GABAPENTIN 300 MG PO CAPS
300.0000 mg | ORAL_CAPSULE | Freq: Two times a day (BID) | ORAL | Status: DC
  Administered 2019-11-02 – 2019-11-03 (×2): 300 mg via ORAL
  Filled 2019-11-02 (×2): qty 1

## 2019-11-02 NOTE — Progress Notes (Signed)
1 Day Post-Op Procedure(s) (LRB): HYSTERECTOMY ABDOMINAL WITH SALPINGECTOMY (Bilateral) CYSTOSCOPY  Subjective: Patient reports incisional pain.   On PCA   small po intake  Objective: I have reviewed patient's vital signs, intake and output, medications and labs.  General: alert and cooperative Resp: clear to auscultation bilaterally Cardio: regular rate and rhythm, S1, S2 normal, no murmur, click, rub or gallop GI: soft, non-tender; bowel sounds normal; no masses,  no organomegaly incision covered , dry dressing   Assessment: s/p Procedure(s): HYSTERECTOMY ABDOMINAL WITH SALPINGECTOMY (Bilateral) CYSTOSCOPY: stable  Plan: Continue clear liquids . D/C PCA and foley at 1500 and start po pain meds . Ambulation with assistance . Cont IS  SCD while in bed   LOS: 3 days    Gwen Her Kalasia Crafton 11/02/2019, 10:58 AM

## 2019-11-02 NOTE — Progress Notes (Addendum)
Vitals stable and WDL. Incision dressing marked with old drainage. Patient states she has not passed any gas yet.RN encouraged patient to walk in the late afternoon. Patient agrees. RN encouraged patient to try clear liquid diet. Patient states she is hungry but does not want to sit up because of incisional pain. Patient taking IV toradol and on morphine PCA, but patient pain continuously 7-8. RN will continue to monitor.

## 2019-11-02 NOTE — Op Note (Signed)
NAME: [name] [name] George [id number] ACCOUNT [id number] DATE OF BIRTH:04/07/1976 FACILITY: [facility] LOCATION: [facility] PHYSICIAN:Helmer Dull Josefine Class, MD  OPERATIVE REPORT  DATE OF PROCEDURE:  11/01/2019  PREOPERATIVE DIAGNOSES: 1.  Symptomatic fibroid uterus. 2.  Flank pain. 3.  Bilateral ureterolithiasis.  POSTOPERATIVE DIAGNOSES: 1.  Symptomatic fibroid uterus. 2.  Left ovarian cyst, consistent with an endometrioma. 3.  Left ovarian endometrioma   PROCEDURE: 1.  Total abdominal hysterectomy. 2.  Bilateral salpingectomy. 3.  Left ovarian cystectomy. 4.  Cystoscopy.  ANESTHESIA:  General endotracheal anesthesia.  SURGEON:  Laverta Baltimore, MD  FIRST ASSISTANT:  Benjaman Kindler, MD  INDICATIONS:  A 44 year old female admitted from the Emergency Department 2 days prior with a 3-week history of left flank pain and abdominal pain.  The patient underwent a CT scan that showed extremely enlarged fibroid uterus with a 19 x 18 cm posterior  fibroid with possible compression of the right ureter.  DESCRIPTION OF PROCEDURE:  After adequate general endotracheal anesthesia, the patient was placed in dorsal supine position.  The abdomen, perineum and vagina were prepped and draped in normal sterile fashion.  Timeout was performed.  The patient did  receive 3 g of IV Ancef prior to commencement of the case.  Given the size of the uterus and fibroids, approximately 24 cm, a midline vertical incision was made.  Sharp dissection was used to identify the fascia.  Fascia was opened in the midline and the  peritoneal cavity was opened with sharp dissection.  Given the girth and the width of the fibroid uterus, some difficulty was encountered, but ultimately the uterus was delivered through the incision.  The round ligaments were then bilaterally clamped,  transected and suture ligated with 0 Vicryl suture.  The uteroovarian ligaments were bilaterally clamped, transected and  suture ligated with 0 Vicryl suture.  The fallopian tubes were identified, but were addressed after the uterus was delivered.   Skeletization took place slowly and a stepwise approach of clamping the vessels on the lateral aspect of each side of the uterus.  Ultimately, the uterine artery and vein were clamped, transected, suture ligated with 0 Vicryl suture.  The  uterus was then amputated at the isthmic portion of the cervicouterine junction.  A large bulbous uterus was passed off the operative field. Weight of uterus 5+ pounds .   Bladder flap had been previously created and vesicular cervical pelvic fascia was pushed caudad from the cervix.   Cardinal ligaments were straight clamped with Heaney clamps, transected and suture ligated with 0 Vicryl suture and Heaney clamps were used to place at the cervical angles and the cervix was delivered intact.  The vaginal cuff was then closed with  interrupted 0 Vicryl suture.  Attention was then directed to the patient's fallopian tubes, which were grasped with a Babcock clamp and the mesosalpinx was clamped and the fallopian tubes were removed and sent to pathology for identification.  Each  pedicle was closed with 0 Vicryl suture.  The left ovarian cyst, approximately 3 x 2 cm, was identified and opened and chocolate-colored fluid was removed.  The cyst was opened and the cyst wall was identified and it was removed in its entirety.  The  cyst wall will be sent to pathology for identification.  Good hemostasis was noted.  The patient's abdomen was copiously irrigated and good hemostasis was noted.  The previously placed O'Connor-O'Sullivan retractor was removed with all laparotomy  sponges.  The fascia was then closed with a 1 PDS  looped suture and the fascia was closed in a modified Smead-Jones fashion.  Good approximation of the fascial edges.  The fascial edges were then injected with a solution of 20 mL of 1.3% Exparel plus 30  mL of 0.5% Marcaine plus 50 mL  normal saline; approximately 30 mL of the solution was injected at the fascial edges.  Subcutaneous tissues, which were approximately 6 cm in depth, were irrigated and bovied for hemostasis and this dead space was closed  with a running 2-0 chromic suture.  The skin was reapproximated with Insorb absorbable staples. An additional 25 cc of exparel solution injected beneath the skin . Given the amount of dissection and the difficulty of the case, a cystoscopy was done at the end of the procedure.  The bladder was filled with 300 mL of  normal saline and 0.5 mL of fluorescein was injected intravenously.  Normal pluming effect seen from each distal ureter as it entered into the bladder.  Bladder was drained and the Foley catheter was replaced.  COMPLICATIONS:  There were no complications.  ESTIMATED BLOOD LOSS:  900 mL.  INTRAOPERATIVE FLUIDS:  2200 mL.  URINE OUTPUT:  300 mL.  DISPOSITION:  The patient was taken to recovery room in good condition.  VN/NUANCE  D:11/01/2019 T:11/02/2019 JOB:[id number]

## 2019-11-02 NOTE — Progress Notes (Addendum)
Patient tolerated 50% of dinner. Ambulated to the bathroom and tolerated well. Tolerating PO pain meds. RN has done incentive spirometry several times throughout the shift with patient. RN encourages patient to continue throughout the night.

## 2019-11-03 MED ORDER — GABAPENTIN 300 MG PO CAPS
300.0000 mg | ORAL_CAPSULE | Freq: Two times a day (BID) | ORAL | 0 refills | Status: DC
Start: 2019-11-03 — End: 2020-11-24

## 2019-11-03 MED ORDER — IBUPROFEN 800 MG PO TABS: 800.0000 mg | ORAL_TABLET | Freq: Four times a day (QID) | ORAL | 0 refills | Status: DC

## 2019-11-03 MED ORDER — OXYCODONE-ACETAMINOPHEN 5-325 MG PO TABS: 1.0000 | ORAL_TABLET | ORAL | 0 refills | Status: DC | PRN

## 2019-11-03 MED ORDER — ONDANSETRON HCL 4 MG PO TABS: 4.0000 mg | ORAL_TABLET | Freq: Four times a day (QID) | ORAL | 0 refills | Status: DC | PRN
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
## 2019-11-03 NOTE — Progress Notes (Signed)
Discharge instructions complete and prescriptions sent to the pharmacy by the provider. Patient verbalizes understanding of teaching. Patient discharged home via wheelchair at 1400.

## 2019-11-03 NOTE — Discharge Summary (Signed)
Physician Discharge Summary  Patient ID: Jennifer [name] MRN: [id number] DOB/AGE: Nov 22, 1975 44 y.o.  Admit date: 10/30/2019 Discharge date: 11/03/2019  Admission Diagnoses:symptomatic fibroid uterus, flank pain with bilateral urolithiasis  Discharge Diagnoses:  Active Problems:   Fibroid uterus   Postoperative state bilateral urolithiasis  Discharged Condition: good  Hospital Course: pt admitted and then underwent a TAH , bilateral salpingectomy , and left ovarian cystectomy . Post operatively did well . By POD#2 she was tolerating regular diet , ambulating and urinating without difficulty . Incision C/D/I  Consults: urology, did not think she needed stenting prior to surgery and will follow up with her out pt . Dr Erlene Quan   Significant Diagnostic Studies: labs:  Results for orders placed or performed during the hospital encounter of 10/30/19 (from the past 72 hour(s))  Prepare RBC (crossmatch)     Status: None   Collection Time: 11/01/19 12:46 PM  Result Value Ref Range   Order Confirmation      ORDER PROCESSED BY BLOOD BANK Performed at [facility], [address] [place], [place] [address]   CBC     Status: Abnormal   Collection Time: 11/02/19  5:54 AM  Result Value Ref Range   WBC 15.2 (H) 4.0 - 10.5 K/uL   RBC 3.59 (L) 3.87 - 5.11 MIL/uL   Hemoglobin 11.5 (L) 12.0 - 15.0 g/dL   HCT 34.9 (L) 36.0 - 46.0 %   MCV 97.2 80.0 - 100.0 fL   MCH 32.0 26.0 - 34.0 pg   MCHC 33.0 30.0 - 36.0 g/dL   RDW 15.1 11.5 - 15.5 %   Platelets 325 150 - 400 K/uL   nRBC 0.0 0.0 - 0.2 %    Comment: Performed at [facility], [address] [place], [place] [address]  Comprehensive metabolic panel     Status: Abnormal   Collection Time: 11/02/19  5:54 AM  Result Value Ref Range   Sodium 140 135 - 145 mmol/L   Potassium 4.0 3.5 - 5.1 mmol/L   Chloride 106 98 - 111 mmol/L   CO2 26 22 - 32 mmol/L   Glucose, Bld 118 (H) 70 - 99 mg/dL    Comment: Glucose reference range  applies only to samples taken after fasting for at least 8 hours.   BUN 12 6 - 20 mg/dL   Creatinine, Ser 0.85 0.44 - 1.00 mg/dL   Calcium 8.2 (L) 8.9 - 10.3 mg/dL   Total Protein 6.6 6.5 - 8.1 g/dL   Albumin 3.5 3.5 - 5.0 g/dL   AST 32 15 - 41 U/L   ALT 31 0 - 44 U/L   Alkaline Phosphatase 40 38 - 126 U/L   Total Bilirubin 0.5 0.3 - 1.2 mg/dL   GFR calc non Af Amer >60 >60 mL/min   GFR calc Af Amer >60 >60 mL/min   Anion gap 8 5 - 15    Comment: Performed at [facility], [address], [place],  [address]    Treatments: surgery as above   Discharge Exam: Blood pressure 128/76, pulse 64, temperature 98.3 F (36.8 C), temperature source Oral, resp. rate 18, height 5\' 4"  (1.626 m), weight 108.9 kg, SpO2 94 %. General appearance: alert and cooperative Resp: clear to auscultation bilaterally Chest wall: no tenderness Cardio: regular rate and rhythm, S1, S2 normal, no murmur, click, rub or gallop GI: soft, non-tender; bowel sounds normal; no masses,  no organomegaly and incision C/D/I  Disposition: Discharge disposition: 01-Home or  Self Care       Discharge Instructions    Call MD for:   Complete by: As directed    Wound redness , drainage , heavy vaginal bleeding   Call MD for:  difficulty breathing, headache or visual disturbances   Complete by: As directed    Call MD for:  extreme fatigue   Complete by: As directed    Call MD for:  hives   Complete by: As directed    Call MD for:  persistant dizziness or light-headedness   Complete by: As directed    Call MD for:  persistant nausea and vomiting   Complete by: As directed    Call MD for:  redness, tenderness, or signs of infection (pain, swelling, redness, odor or green/yellow discharge around incision site)   Complete by: As directed    Call MD for:  severe uncontrolled pain   Complete by: As directed    Call MD for:  temperature >100.4   Complete by: As directed    Diet - low sodium heart  healthy   Complete by: As directed    Increase activity slowly   Complete by: As directed      Allergies as of 11/03/2019      Reactions   Amoxicillin Swelling   Hydrocodone Other (See Comments)   headache      Medication List    STOP taking these medications   naproxen 500 MG tablet Commonly known as: Naprosyn     TAKE these medications   cetirizine 10 MG tablet Commonly known as: ZYRTEC Take 10 mg by mouth daily.   escitalopram 10 MG tablet Commonly known as: Lexapro Take 1 tablet (10 mg total) by mouth daily. What changed:   how much to take  when to take this   gabapentin 300 MG capsule Commonly known as: Neurontin Take 1 capsule (300 mg total) by mouth 2 (two) times daily.   ibuprofen 800 MG tablet Commonly known as: ADVIL Take 1 tablet (800 mg total) by mouth every 6 (six) hours.   ondansetron 4 MG tablet Commonly known as: ZOFRAN Take 1 tablet (4 mg total) by mouth every 6 (six) hours as needed for nausea.   oxyCODONE-acetaminophen 5-325 MG tablet Commonly known as: Percocet Take 1 tablet by mouth every 4 (four) hours as needed for moderate pain.      Follow-up Information    Hollice Espy, MD. Go on 11/12/2019.   Specialty: Urology Why: at 2:15pm for 2 week follow up appointment Contact information: [place] [address] [phone]        Aikeem Lilley, Gwen Her, MD Follow up in 2 week(s).   Specialty: Obstetrics and Gynecology Why: post op  Contact information: [address] [place] [address] [phone]           Signed: Gwen Her Kimiyah Blick 11/03/2019, 1:10 PM

## 2019-11-05 LAB — SURGICAL PATHOLOGY

## 2019-11-12 ENCOUNTER — Encounter: Payer: Self-pay | Admitting: Urology

## 2019-11-12 ENCOUNTER — Ambulatory Visit
Admission: RE | Admit: 2019-11-12 | Discharge: 2019-11-12 | Disposition: A | Discharge status: Home / Self Care | Payer: Self-pay | Source: Ambulatory Visit | Attending: Urology | Admitting: Urology

## 2019-11-12 ENCOUNTER — Other Ambulatory Visit: Payer: Self-pay

## 2019-11-12 ENCOUNTER — Ambulatory Visit (INDEPENDENT_AMBULATORY_CARE_PROVIDER_SITE_OTHER): Payer: Self-pay | Admitting: Urology

## 2019-11-12 VITALS — BP 136/82 | HR 92

## 2019-11-12 DIAGNOSIS — D259 Leiomyoma of uterus, unspecified: Secondary | ICD-10-CM

## 2019-11-12 DIAGNOSIS — N200 Calculus of kidney: Secondary | ICD-10-CM

## 2019-11-12 DIAGNOSIS — R300 Dysuria: Secondary | ICD-10-CM

## 2019-11-12 MED ORDER — SULFAMETHOXAZOLE-TRIMETHOPRIM 800-160 MG PO TABS
1.0000 | ORAL_TABLET | Freq: Two times a day (BID) | ORAL | 0 refills | Status: DC
Start: 2019-11-12 — End: 2019-11-12

## 2019-11-12 MED ORDER — SULFAMETHOXAZOLE-TRIMETHOPRIM 800-160 MG PO TABS: 1.0000 | ORAL_TABLET | Freq: Two times a day (BID) | ORAL | 0 refills | Status: DC

## 2019-11-12 NOTE — Progress Notes (Signed)
11/12/19 10:43 AM   Jennifer [name] [birth year] [id number]  Referring provider: [facility], [place],  [place] [address] Chief Complaint  Patient presents with  . Follow-up    HPI: Jennifer [name] is a 44 y.o. F who presents today for the evaluation and management of right renal pelvic stone.   She presented to ED w/ LEFT flank pain and LLQ pelvic pain. No leukocytosis. Urine with some RBCs and WBCs but no bacteria nitrite negative.  Creatinine is normal. Hemodynamically stable.  CT scan from 10/30/19 was personally reviewed.  Indicates an incidental RIGHT UPJ stone without a particularly obstructive appearance in some inflammatory change around the stone with some renal pelvic thickening.  Minimal to no hydronephrosis on this side.  She reports of bladder pain which worsens w/ voiding. She states of urinating q 2 hours.  Notably, she does report postoperative urinary retention requiring a Foley catheter during her admission after hysterectomy.  She also reports of lower back pain in the middle. Her left lower back pain from ED visit has resolved.    She has taken blood thinners including 800 mg ibuprofen and Aleve this week.   SHx of hysterectomy. Tolerated well.   PMH: Past Medical History:  Diagnosis Date  . Depression   . Uterine fibroid     Surgical History: Past Surgical History:  Procedure Laterality Date  . CYSTOSCOPY  11/01/2019   Procedure: CYSTOSCOPY;  Surgeon: Schermerhorn, Gwen Her, MD;  Location: [facility] ORS;  Service: Gynecology;;  . HYSTERECTOMY ABDOMINAL WITH SALPINGO-OOPHORECTOMY Bilateral 11/01/2019   Procedure: HYSTERECTOMY ABDOMINAL WITH SALPINGECTOMY;  Surgeon: Ouida Sills Gwen Her, MD;  Location: [facility] ORS;  Service: Gynecology;  Laterality: Bilateral;    Home Medications:  Allergies as of 11/12/2019      Reactions   Amoxicillin Swelling   Hydrocodone Other (See Comments)   headache      Medication  List       Accurate as of Nov 12, 2019 11:59 PM. If you have any questions, ask your nurse or doctor.        cetirizine 10 MG tablet Commonly known as: ZYRTEC Take 10 mg by mouth daily.   escitalopram 10 MG tablet Commonly known as: Lexapro Take 1 tablet (10 mg total) by mouth daily. What changed:   how much to take  when to take this   gabapentin 300 MG capsule Commonly known as: Neurontin Take 1 capsule (300 mg total) by mouth 2 (two) times daily.   ibuprofen 800 MG tablet Commonly known as: ADVIL Take 1 tablet (800 mg total) by mouth every 6 (six) hours.   ondansetron 4 MG tablet Commonly known as: ZOFRAN Take 1 tablet (4 mg total) by mouth every 6 (six) hours as needed for nausea.   oxyCODONE-acetaminophen 5-325 MG tablet Commonly known as: Percocet Take 1 tablet by mouth every 4 (four) hours as needed for moderate pain.   sulfamethoxazole-trimethoprim 800-160 MG tablet Commonly known as: BACTRIM DS Take 1 tablet by mouth every 12 (twelve) hours. Started by: Hollice Espy, MD       Allergies:  Allergies  Allergen Reactions  . Amoxicillin Swelling  . Hydrocodone Other (See Comments)    headache    Family History: No family history on file.  Social History:  reports that she has quit smoking. Her smoking use included cigarettes. She smoked 0.50 packs per day. She has never used smokeless tobacco. She reports that she does not drink alcohol  or use drugs.   Physical Exam: BP 136/82   Pulse 92   Constitutional:  Alert and oriented, No acute distress. HEENT: [place] AT, moist mucus membranes.  Trachea midline, no masses. Cardiovascular: No clubbing, cyanosis, or edema. Respiratory: Normal respiratory effort, no increased work of breathing. Skin: No rashes, bruises or suspicious lesions. Neurologic: Grossly intact, no focal deficits, moving all 4 extremities. Psychiatric: Normal mood and affect.  Laboratory Data:  Lab Results  Component Value Date    CREATININE 0.85 11/02/2019    Urinalysis 11-30 WBC, 11-30 RBC, nitrite negative.   Pertinent Imaging: Results for orders placed during the hospital encounter of 10/30/19  CT Renal Stone Study   Narrative CLINICAL DATA:  Left flank pain  EXAM: CT ABDOMEN AND PELVIS WITHOUT CONTRAST  TECHNIQUE: Multidetector CT imaging of the abdomen and pelvis was performed following the standard protocol without IV contrast.  COMPARISON:  CT 06/12/2009, pelvic ultrasound 03/17/2017  FINDINGS: Lower chest: Lung bases demonstrate no acute consolidation or pleural effusion. Small hiatal hernia. Thickened appearance at the GE junction.  Hepatobiliary: Heterogenous hepatic steatosis. No calcified gallstone or biliary dilatation.  Pancreas: Unremarkable. No pancreatic ductal dilatation or surrounding inflammatory changes.  Spleen: Normal in size without focal abnormality.  Adrenals/Urinary Tract: Adrenal glands are normal. Cortical scarring within the left kidney. Bilateral intrarenal stones. These measure up to 5 mm in the lower pole left kidney. 9 mm stone in the right renal pelvis. There is mild right hydronephrosis. Suspicion in urothelial thickening of the right renal pelvis. Right ureter is slightly dilated but no definitive ureteral stone. The bladder is nearly empty and is displaced to the right pelvis.  Stomach/Bowel: Stomach is within normal limits. Appendix appears normal. No evidence of bowel wall thickening, distention, or inflammatory changes.  Vascular/Lymphatic: Nonaneurysmal aorta. No suspicious adenopathy  Reproductive: Markedly enlarged, uterus extends to above the umbilicus. Large heterogeneous uterine mass measuring at least 16.4 cm transverse by 18 cm AP by 19 cm craniocaudad. Internal low-attenuation areas possibly due to cystic degeneration. Tubular structure containing air paralleling the left uterine mass presumably represent displaced uterus from the exophytic  fibroid. Bilateral tubal occlusion devices along the anterior aspect of the mass.  Other: Negative for free air or free fluid.  Musculoskeletal: Degenerative changes of the spine. No acute or suspicious osseous abnormality  IMPRESSION: 1. Markedly enlarged uterus with large heterogeneous uterine mass measuring up to 18 cm in diameter, suspected to represent a large exophytic fibroid. Tubular structure along the left aspect of the uterine mass, suspected to represent displaced uterus. There is air within the vagina and endometrial cavity. Air in the endometrium could be refluxed from the vagina, correlate for any recent manipulation. 2. Mild right hydronephrosis, secondary to a 9 mm stone in the right renal pelvis. There appears to be mild urothelial thickening of the right renal pelvis which could be secondary to infection or inflammation. Right ureter is also slightly dilated but no ureteral stone is seen. Suspect that there is a distal obstruction of right ureter by the large uterine mass. There are intrarenal stones bilaterally. 3. Heterogenous hepatic steatosis. 4. Small hiatal hernia. Thickened appearance at the GE junction. Consider correlation with endoscopy.   Electronically Signed   By: Donavan Foil M.D.   On: 10/30/2019 16:09    I have personally reviewed the images and agree with radiologist interpretation.   Assessment & Plan:    1. Nephrolithiasis  CT revealed right UPJ stone, 9 mm Given size and location of  the stone, I would recommend intervention for the stone in particular.  She also has a nonobstructing smaller stone on the left which you can discuss down the road. KUB today, will call w/ results  We discussed various treatment options including ESWL vs. ureteroscopy, laser lithotripsy, and stent. We discussed the risks and benefits of both including bleeding, infection, damage to surrounding structures, efficacy with need for possible further  intervention, and need for temporary ureteral stent. Advised to stop NSAIDs 3 days before surgery  Will consider ESWL based on KUB finding   2. Dysuria Will treat presumed UTI given UA and upcoming kidney stone surgery  Culture sent  Rx of Bactrim sent to [facility] [address] [place], [place] [address] [phone]  I, Lucas Mallow, am acting as a scribe for Dr. Hollice Espy,  I have reviewed the above documentation for accuracy and completeness, and I agree with the above.   Hollice Espy, MD

## 2019-11-13 ENCOUNTER — Other Ambulatory Visit: Payer: Self-pay | Admitting: Radiology

## 2019-11-13 DIAGNOSIS — N200 Calculus of kidney: Secondary | ICD-10-CM

## 2019-11-13 LAB — MICROSCOPIC EXAMINATION

## 2019-11-13 LAB — URINALYSIS, COMPLETE
Bilirubin, UA: NEGATIVE
Glucose, UA: NEGATIVE
Ketones, UA: NEGATIVE
Nitrite, UA: NEGATIVE
Specific Gravity, UA: 1.025 (ref 1.005–1.030)
Urobilinogen, Ur: 0.2 mg/dL (ref 0.2–1.0)
pH, UA: 6 (ref 5.0–7.5)

## 2019-11-15 LAB — CULTURE, URINE COMPREHENSIVE

## 2019-11-18 DIAGNOSIS — N809 Endometriosis, unspecified: Secondary | ICD-10-CM | POA: Insufficient documentation

## 2019-11-19 ENCOUNTER — Other Ambulatory Visit: Payer: Self-pay

## 2019-11-19 ENCOUNTER — Other Ambulatory Visit
Admission: RE | Admit: 2019-11-19 | Discharge: 2019-11-19 | Disposition: A | Discharge status: Home / Self Care | Payer: HRSA Program | Source: Ambulatory Visit | Attending: Urology | Admitting: Urology

## 2019-11-19 DIAGNOSIS — Z20822 Contact with and (suspected) exposure to covid-19: Secondary | ICD-10-CM | POA: Insufficient documentation

## 2019-11-19 DIAGNOSIS — Z01812 Encounter for preprocedural laboratory examination: Secondary | ICD-10-CM | POA: Diagnosis present

## 2019-11-19 LAB — SARS CORONAVIRUS 2 (TAT 6-24 HRS): SARS Coronavirus 2: NEGATIVE

## 2019-11-21 ENCOUNTER — Ambulatory Visit: Payer: Self-pay

## 2019-11-21 ENCOUNTER — Ambulatory Visit
Admission: RE | Admit: 2019-11-21 | Discharge: 2019-11-21 | Disposition: A | Discharge status: Home / Self Care | Payer: Self-pay | Attending: Urology | Admitting: Urology

## 2019-11-21 ENCOUNTER — Encounter: Payer: Self-pay | Admitting: Urology

## 2019-11-21 ENCOUNTER — Encounter
Admission: RE | Disposition: A | Discharge status: Home / Self Care | Payer: Self-pay | Source: Home / Self Care | Attending: Urology

## 2019-11-21 ENCOUNTER — Other Ambulatory Visit: Payer: Self-pay

## 2019-11-21 DIAGNOSIS — Z79899 Other long term (current) drug therapy: Secondary | ICD-10-CM | POA: Insufficient documentation

## 2019-11-21 DIAGNOSIS — Z87891 Personal history of nicotine dependence: Secondary | ICD-10-CM | POA: Insufficient documentation

## 2019-11-21 DIAGNOSIS — F329 Major depressive disorder, single episode, unspecified: Secondary | ICD-10-CM | POA: Insufficient documentation

## 2019-11-21 DIAGNOSIS — N132 Hydronephrosis with renal and ureteral calculous obstruction: Secondary | ICD-10-CM | POA: Insufficient documentation

## 2019-11-21 DIAGNOSIS — N200 Calculus of kidney: Secondary | ICD-10-CM

## 2019-11-21 DIAGNOSIS — Z880 Allergy status to penicillin: Secondary | ICD-10-CM | POA: Insufficient documentation

## 2019-11-21 DIAGNOSIS — Z885 Allergy status to narcotic agent status: Secondary | ICD-10-CM | POA: Insufficient documentation

## 2019-11-21 HISTORY — PX: EXTRACORPOREAL SHOCK WAVE LITHOTRIPSY: SHX1557

## 2019-11-21 SURGERY — LITHOTRIPSY, ESWL
Anesthesia: Moderate Sedation | Laterality: Right

## 2019-11-21 MED ORDER — ONDANSETRON HCL 4 MG/2ML IJ SOLN: 4.0000 mg | Freq: Once | INTRAMUSCULAR | Status: DC | PRN

## 2019-11-21 MED ORDER — DIPHENHYDRAMINE HCL 25 MG PO CAPS
ORAL_CAPSULE | ORAL | Status: AC
  Administered 2019-11-21: 25 mg via ORAL
  Filled 2019-11-21: qty 1

## 2019-11-21 MED ORDER — DIPHENHYDRAMINE HCL 25 MG PO CAPS: 25.0000 mg | ORAL_CAPSULE | ORAL | Status: AC

## 2019-11-21 MED ORDER — CIPROFLOXACIN HCL 500 MG PO TABS: 500.0000 mg | ORAL_TABLET | ORAL | Status: AC

## 2019-11-21 MED ORDER — OXYCODONE-ACETAMINOPHEN 5-325 MG PO TABS: 1.0000 | ORAL_TABLET | ORAL | 0 refills | Status: AC | PRN

## 2019-11-21 MED ORDER — CIPROFLOXACIN HCL 500 MG PO TABS
ORAL_TABLET | ORAL | Status: AC
  Administered 2019-11-21: 500 mg via ORAL
  Filled 2019-11-21: qty 1

## 2019-11-21 MED ORDER — DIAZEPAM 5 MG PO TABS: 10.0000 mg | ORAL_TABLET | ORAL | Status: AC

## 2019-11-21 MED ORDER — SODIUM CHLORIDE 0.9 % IV SOLN: INTRAVENOUS | Status: DC

## 2019-11-21 MED ORDER — TAMSULOSIN HCL 0.4 MG PO CAPS
0.4000 mg | ORAL_CAPSULE | Freq: Every day | ORAL | 0 refills | Status: DC
Start: 2019-11-21 — End: 2019-12-17

## 2019-11-21 MED ORDER — DIAZEPAM 5 MG PO TABS
ORAL_TABLET | ORAL | Status: AC
  Administered 2019-11-21: 10 mg via ORAL
  Filled 2019-11-21: qty 2

## 2019-11-21 MED ORDER — ONDANSETRON HCL 4 MG/2ML IJ SOLN
INTRAMUSCULAR | Status: AC
  Filled 2019-11-21: qty 2

## 2019-11-21 NOTE — Brief Op Note (Signed)
11/21/2019  8:23 AM  PATIENT:  Jennifer [name]  44 y.o. female  PRE-OPERATIVE DIAGNOSIS:  Right 1cm renal pelvis stone  POST-OPERATIVE DIAGNOSIS:  Same  PROCEDURE:  Procedure(s): EXTRACORPOREAL SHOCK WAVE LITHOTRIPSY (ESWL) (Right)  SURGEON:  Surgeon(s) and Role:    * Billey Co, MD - Primary  ANESTHESIA: Conscious Sedation  EBL:  None  Drains: None  Specimen: None  Findings:  1.Excellent smudging of stone on flouroscopy  DISPO: Flomax, pain meds PRN, RTC 2 weeks KUB  [name], MD 11/21/2019

## 2019-11-21 NOTE — Discharge Instructions (Signed)
Kidney Stones  Kidney stones are solid, rock-like deposits that form inside of the kidneys. The kidneys are a pair of organs that make urine. A kidney stone may form in a kidney and move into other parts of the urinary tract, including the tubes that connect the kidneys to the bladder (ureters), the bladder, and the tube that carries urine out of the body (urethra). As the stone moves through these areas, it can cause intense pain and block the flow of urine. Kidney stones are created when high levels of certain minerals are found in the urine. The stones are usually passed out of the body through urination, but in some cases, medical treatment may be needed to remove them. What are the causes? Kidney stones may be caused by:  A condition in which certain glands produce too much parathyroid hormone (primary hyperparathyroidism), which causes too much calcium buildup in the blood.  A buildup of uric acid crystals in the bladder (hyperuricosuria). Uric acid is a chemical that the body produces when you eat certain foods. It usually exits the body in the urine.  Narrowing (stricture) of one or both of the ureters.  A kidney blockage that is present at birth (congenital obstruction).  Past surgery on the kidney or the ureters, such as gastric bypass surgery. What increases the risk? The following factors may make you more likely to develop this condition:  Having had a kidney stone in the past.  Having a family history of kidney stones.  Not drinking enough water.  Eating a diet that is high in protein, salt (sodium), or sugar.  Being overweight or obese. What are the signs or symptoms? Symptoms of a kidney stone may include:  Pain in the side of the abdomen, right below the ribs (flank pain). Pain usually spreads (radiates) to the groin.  Needing to urinate frequently or urgently.  Painful urination.  Blood in the urine (hematuria).  Nausea.  Vomiting.  Fever and chills. How  is this diagnosed? This condition may be diagnosed based on:  Your symptoms and medical history.  A physical exam.  Blood tests.  Urine tests. These may be done before and after the stone passes out of your body through urination.  Imaging tests, such as a CT scan, abdominal X-ray, or ultrasound.  A procedure to examine the inside of the bladder (cystoscopy). How is this treated? Treatment for kidney stones depends on the size, location, and makeup of the stones. Kidney stones will often pass out of the body through urination. You may need to:  Increase your fluid intake to help pass the stone. In some cases, you may be given fluids through an IV and may need to be monitored at the hospital.  Take medicine for pain.  Make changes in your diet to help prevent kidney stones from coming back. Sometimes, medical procedures are needed to remove a kidney stone. This may involve:  A procedure to break up kidney stones using: ? A focused beam of light (laser therapy). ? Shock waves (extracorporeal shock wave lithotripsy).  Surgery to remove kidney stones. This may be needed if you have severe pain or have stones that block your urinary tract. Follow these instructions at home: Medicines  Take over-the-counter and prescription medicines only as told by your health care provider.  Ask your health care provider if the medicine prescribed to you requires you to avoid driving or using heavy machinery. Eating and drinking  Drink enough fluid to keep your urine pale yellow.   You may be instructed to drink at least 8-10 glasses of water each day. This will help you pass the kidney stone.  If directed, change your diet. This may include: ? Limiting how much sodium you eat. ? Eating more fruits and vegetables. ? Limiting how much animal protein--such as red meat, poultry, fish, and eggs--you eat.  Follow instructions from your health care provider about eating or drinking  restrictions. General instructions  Collect urine samples as told by your health care provider. You may need to collect a urine sample: ? 24 hours after you pass the stone. ? 8-12 weeks after passing the kidney stone, and every 6-12 months after that.  Strain your urine every time you urinate, for as long as directed. Use the strainer that your health care provider recommends.  Do not throw out the kidney stone after passing it. Keep the stone so it can be tested by your health care provider. Testing the makeup of your kidney stone may help prevent you from getting kidney stones in the future.  Keep all follow-up visits as told by your health care provider. This is important. You may need follow-up X-rays or ultrasounds to make sure that your stone has passed. How is this prevented? To prevent another kidney stone:  Drink enough fluid to keep your urine pale yellow. This is the best way to prevent kidney stones.  Eat a healthy diet and follow recommendations from your health care provider about foods to avoid. You may be instructed to eat a low-protein diet. Recommendations vary depending on the type of kidney stone that you have.  Maintain a healthy weight. Where to find more information  National Kidney Foundation (NKF): www.kidney.org  [employer] (UCF): www.urologyhealth.org Contact a health care provider if:  You have pain that gets worse or does not get better with medicine. Get help right away if:  You have a fever or chills.  You develop severe pain.  You develop new abdominal pain.  You faint.  You are unable to urinate. Summary  Kidney stones are solid, rock-like deposits that form inside of the kidneys.  Kidney stones can cause nausea, vomiting, blood in the urine, abdominal pain, and the urge to urinate frequently.  Treatment for kidney stones depends on the size, location, and makeup of the stones. Kidney stones will often pass out of the body  through urination.  Kidney stones can be prevented by drinking enough fluids, eating a healthy diet, and maintaining a healthy weight. This information is not intended to replace advice given to you by your health care provider. Make sure you discuss any questions you have with your health care provider. Document Revised: 10/30/2018 Document Reviewed: 10/30/2018 Elsevier Patient Education  2020 Elsevier Inc.  

## 2019-11-26 ENCOUNTER — Ambulatory Visit: Payer: Self-pay

## 2019-12-04 ENCOUNTER — Other Ambulatory Visit: Payer: Self-pay

## 2019-12-04 DIAGNOSIS — N200 Calculus of kidney: Secondary | ICD-10-CM

## 2019-12-04 NOTE — Progress Notes (Signed)
k

## 2019-12-05 ENCOUNTER — Ambulatory Visit: Payer: Self-pay | Admitting: Physician Assistant

## 2019-12-10 ENCOUNTER — Encounter: Payer: Self-pay | Admitting: Physician Assistant

## 2019-12-10 ENCOUNTER — Ambulatory Visit: Payer: Self-pay | Admitting: Physician Assistant

## 2019-12-17 ENCOUNTER — Ambulatory Visit (INDEPENDENT_AMBULATORY_CARE_PROVIDER_SITE_OTHER): Payer: Self-pay | Admitting: Physician Assistant

## 2019-12-17 ENCOUNTER — Ambulatory Visit
Admission: RE | Admit: 2019-12-17 | Discharge: 2019-12-17 | Disposition: A | Discharge status: Home / Self Care | Payer: Self-pay | Source: Ambulatory Visit | Attending: Physician Assistant | Admitting: Physician Assistant

## 2019-12-17 ENCOUNTER — Encounter: Payer: Self-pay | Admitting: Physician Assistant

## 2019-12-17 ENCOUNTER — Ambulatory Visit
Admission: RE | Admit: 2019-12-17 | Discharge: 2019-12-17 | Disposition: A | Discharge status: Home / Self Care | Payer: Self-pay | Attending: Physician Assistant | Admitting: Physician Assistant

## 2019-12-17 ENCOUNTER — Other Ambulatory Visit: Payer: Self-pay

## 2019-12-17 ENCOUNTER — Ambulatory Visit: Payer: Self-pay | Admitting: Physician Assistant

## 2019-12-17 VITALS — BP 142/85 | HR 79 | Ht 64.0 in | Wt 228.0 lb

## 2019-12-17 DIAGNOSIS — N200 Calculus of kidney: Secondary | ICD-10-CM

## 2019-12-17 DIAGNOSIS — R32 Unspecified urinary incontinence: Secondary | ICD-10-CM

## 2019-12-17 MED ORDER — ONDANSETRON HCL 4 MG PO TABS: 4.0000 mg | ORAL_TABLET | Freq: Four times a day (QID) | ORAL | 0 refills | Status: DC | PRN

## 2019-12-17 MED ORDER — TAMSULOSIN HCL 0.4 MG PO CAPS: 0.4000 mg | ORAL_CAPSULE | Freq: Every day | ORAL | 0 refills | Status: DC

## 2019-12-17 NOTE — Progress Notes (Signed)
12/17/2019 12:00 PM   Jennifer [name] [birth year] [id number]  CC: Chief Complaint  Patient presents with  . Routine Post Op    HPI: Jennifer [name] is a 44 y.o. female who presents today for follow-up s/p ESWL on 11/21/2019 for management of a 1 cm right renal pelvic stone with Dr. Diamantina Providence.  Intraoperative findings notable for excellent smudging of the stone on fluoroscopy.  Per my review of her repeat KUB today, there is apparent clearance of the right renal stone, however there is superimposed bowel gas obstructing the view so I cannot definitively rule out residual fragments there.   Today, patient reports continued pelvic/bladder pain and occasional nausea. She stopped Flomax 1-2 weeks after ESWL. She has been taking naproxen twice daily for pain. She denies fever, chills, and vomiting. She has passed several fragments and brings them with her to clinic today for analysis.  Additionally, she reports bothersome urinary leakage at baseline. She is curious about what medications might be available to address this.  In-office UA today positive for 1+ blood; urine microscopy with 11-30 WBCs/HPF and 3-10 RBCs/HPF.  PMH: Past Medical History:  Diagnosis Date  . Depression   . Kidney stone   . Uterine fibroid     Surgical History: Past Surgical History:  Procedure Laterality Date  . CYSTOSCOPY  11/01/2019   Procedure: CYSTOSCOPY;  Surgeon: Schermerhorn, Gwen Her, MD;  Location: [facility] ORS;  Service: Gynecology;;  . EXTRACORPOREAL SHOCK WAVE LITHOTRIPSY Right 11/21/2019   Procedure: EXTRACORPOREAL SHOCK WAVE LITHOTRIPSY (ESWL);  Surgeon: Billey Co, MD;  Location: [facility] ORS;  Service: Urology;  Laterality: Right;  . HYSTERECTOMY ABDOMINAL WITH SALPINGO-OOPHORECTOMY Bilateral 11/01/2019   Procedure: HYSTERECTOMY ABDOMINAL WITH SALPINGECTOMY;  Surgeon: Schermerhorn, Gwen Her, MD;  Location: [facility] ORS;  Service: Gynecology;  Laterality: Bilateral;    Home Medications:  Allergies as  of 12/17/2019      Reactions   Amoxicillin Swelling   Hydrocodone Other (See Comments)   headache      Medication List       Accurate as of December 17, 2019 12:00 PM. If you have any questions, ask your nurse or doctor.        STOP taking these medications   oxyCODONE-acetaminophen 5-325 MG tablet Commonly known as: Percocet Stopped by: Debroah Loop, PA-C   sulfamethoxazole-trimethoprim 800-160 MG tablet Commonly known as: BACTRIM DS Stopped by: Debroah Loop, PA-C     TAKE these medications   cetirizine 10 MG tablet Commonly known as: ZYRTEC Take 10 mg by mouth daily.   escitalopram 10 MG tablet Commonly known as: Lexapro Take 1 tablet (10 mg total) by mouth daily. What changed:   how much to take  when to take this   estradiol 1 MG tablet Commonly known as: ESTRACE Take by mouth.   gabapentin 300 MG capsule Commonly known as: Neurontin Take 1 capsule (300 mg total) by mouth 2 (two) times daily.   ibuprofen 800 MG tablet Commonly known as: ADVIL Take 1 tablet (800 mg total) by mouth every 6 (six) hours.   nicotine 14 mg/24hr patch Commonly known as: NICODERM CQ - dosed in mg/24 hours Place onto the skin.   nicotine 7 mg/24hr patch Commonly known as: NICODERM CQ - dosed in mg/24 hr Place onto the skin.   nicotine 21 mg/24hr patch Commonly known as: NICODERM CQ - dosed in mg/24 hours Place onto the skin.   omeprazole 40 MG capsule Commonly known as: PRILOSEC Take by mouth.  ondansetron 4 MG tablet Commonly known as: ZOFRAN Take 1 tablet (4 mg total) by mouth every 6 (six) hours as needed for nausea.   tamsulosin 0.4 MG Caps capsule Commonly known as: FLOMAX Take 1 capsule (0.4 mg total) by mouth daily after supper.       Allergies:  Allergies  Allergen Reactions  . Amoxicillin Swelling  . Hydrocodone Other (See Comments)    headache    Family History: No family history on file.  Social History:   reports that she  has been smoking cigarettes. She has been smoking about 1.00 pack per day. She has never used smokeless tobacco. She reports that she does not drink alcohol and does not use drugs.  Physical Exam: BP (!) 142/85 (BP Location: Left Arm, Patient Position: Sitting, Cuff Size: Normal)   Pulse 79   Ht 5' 4" (1.626 m)   Wt 228 lb (103.4 kg)   LMP 08/22/2019   BMI 39.14 kg/m   Constitutional:  Alert and oriented, no acute distress, nontoxic appearing HEENT: [place], AT Cardiovascular: No clubbing, cyanosis, or edema Respiratory: Normal respiratory effort, no increased work of breathing Skin: No rashes, bruises or suspicious lesions Neurologic: Grossly intact, no focal deficits, moving all 4 extremities Psychiatric: Normal mood and affect  Laboratory Data: Results for orders placed or performed in visit on 12/17/19  CULTURE, URINE COMPREHENSIVE   Specimen: Urine   UR  Result Value Ref Range   Urine Culture, Comprehensive Preliminary report    Organism ID, Bacteria Comment   Microscopic Examination   Urine  Result Value Ref Range   WBC, UA 11-30 (A) 0 - 5 /hpf   RBC 3-10 (A) 0 - 2 /hpf   Epithelial Cells (non renal) 0-10 0 - 10 /hpf   Bacteria, UA Few None seen/Few  Urinalysis, Complete  Result Value Ref Range   Specific Gravity, UA 1.025 1.005 - 1.030   pH, UA 5.5 5.0 - 7.5   Color, UA Yellow Yellow   Appearance Ur Cloudy (A) Clear   Leukocytes,UA Negative Negative   Protein,UA Negative Negative/Trace   Glucose, UA Negative Negative   Ketones, UA Negative Negative   RBC, UA 1+ (A) Negative   Bilirubin, UA Negative Negative   Urobilinogen, Ur 0.2 0.2 - 1.0 mg/dL   Nitrite, UA Negative Negative   Microscopic Examination See below:    Pertinent Imaging: KUB, 12/17/2019: CLINICAL DATA:  Kidney stones. LEFT-sided flank pain post lithotripsy  EXAM: ABDOMEN - 1 VIEW  COMPARISON:  11/21/2019  FINDINGS: Calcific density projects over the lower pole the LEFT kidney measuring  approximately 6 mm. Stool and gas overlie the RIGHT renal pelvis. The density seen in this area on the previous exam is no longer visualized. Stool and gas overlie the sacrum. Bowel gas pattern is normal.  No definite calculi seen over the course of LEFT or RIGHT ureter  Visualized skeletal structures on limited assessment without acute process.  IMPRESSION: 1. 6 mm lower pole LEFT renal calculus. 2. RIGHT renal pelvic calculus not seen on current study.   Electronically Signed   By: Zetta Bills M.D.   On: 12/18/2019 08:25  I personally reviewed the images referenced above and note apparent clearance of the right renal stone.  Assessment & Plan:   1. Right renal stone KUB reveals no clear residual right renal stone. With persistent symptoms, suspect she is still passing fragments. Will plan for continued MET with repeat KUB in 1 month.  UA today notable for  pyuria and microscopic hematuria, unclear if this represents infection vs. residual fragment passage. Will send urine for culture and treat as indicated.  Restart Flomax and push fluids; refill sent today. Also represcribing Zofran for occasional nausea. Counseled her to treat pain with 463m ibuprofen + 10049macetaminophen every 6-8 hours as needed. - Urinalysis, Complete - CULTURE, URINE COMPREHENSIVE - Abdomen 1 view (KUB); Future - ondansetron (ZOFRAN) 4 MG tablet; Take 1 tablet (4 mg total) by mouth every 6 (six) hours as needed for nausea.  Dispense: 20 tablet; Refill: 0 - tamsulosin (FLOMAX) 0.4 MG CAPS capsule; Take 1 capsule (0.4 mg total) by mouth daily after supper.  Dispense: 30 capsule; Refill: 0  2. Urinary incontinence, unspecified type Explained that distal fragments may exacerbate baseline lower urinary symptoms; would like to defer medication/further evaluation of this pending clearance of residual fragments. She is in agreement with this plan.  Return in about 1 month (around 01/16/2020) for Stone  f/u with KUB prior.  SaDebroah LoopPA-C  [facility] [address] [phone]

## 2019-12-17 NOTE — Patient Instructions (Addendum)
1. Restart daily Flomax and stay well hydrated to allow your residual stone fragments to pass. 2. Take Zofran for nausea as needed. 3. For pain, you may take Advil (ibuprofen) 400mg  and Tylenol (acetaminophen) 1000mg  every 6-8 hours as needed. 4. I have sent your urine for culture and will contact you if I need to start you on antibiotics. 5. I will send your stone fragments for analysis today; we will discuss your results at your next visit. 6. I will see you back in clinic in 1 month with another X-ray prior.

## 2019-12-19 LAB — URINALYSIS, COMPLETE
Bilirubin, UA: NEGATIVE
Glucose, UA: NEGATIVE
Ketones, UA: NEGATIVE
Leukocytes,UA: NEGATIVE
Nitrite, UA: NEGATIVE
Protein,UA: NEGATIVE
Specific Gravity, UA: 1.025 (ref 1.005–1.030)
Urobilinogen, Ur: 0.2 mg/dL (ref 0.2–1.0)
pH, UA: 5.5 (ref 5.0–7.5)

## 2019-12-19 LAB — MICROSCOPIC EXAMINATION

## 2019-12-20 LAB — CULTURE, URINE COMPREHENSIVE

## 2019-12-27 LAB — CALCULI, WITH PHOTOGRAPH (CLINICAL LAB)
Calcium Oxalate Dihydrate: 70 %
Calcium Oxalate Monohydrate: 20 %
Hydroxyapatite: 10 %
Weight Calculi: 88 mg

## 2020-01-16 ENCOUNTER — Encounter: Payer: Self-pay | Admitting: Physician Assistant

## 2020-01-16 ENCOUNTER — Ambulatory Visit: Payer: Self-pay | Admitting: Physician Assistant

## 2020-05-29 ENCOUNTER — Ambulatory Visit: Payer: Self-pay | Admitting: Urology

## 2020-05-29 ENCOUNTER — Encounter: Payer: Self-pay | Admitting: Urology

## 2020-05-29 ENCOUNTER — Other Ambulatory Visit: Payer: Self-pay

## 2020-05-29 DIAGNOSIS — N200 Calculus of kidney: Secondary | ICD-10-CM

## 2020-06-03 NOTE — Progress Notes (Signed)
06/04/2020 1:01 PM   Jennifer [name] 05-22-1976 [id number]  Referring provider: Donnie Coffin, MD [place],  [place] [address]  Chief Complaint  Patient presents with  . Nephrolithiasis    HPI: Jennifer [name] is a 44 y.o. female with an urological history of nephrolithiasis who presents today for symptoms of nausea, painful urination, pain in the kidneys and pain in the pelvic area.    ESWL x several.  Stone composition unknown.  No recent metabolic work up.    Today she reports bilateral flank pain that has been occurring for the last 3 to 4 weeks with the left being more intense.  She is also experiencing dysuria, urgency, frequency and pelvic pain for the last 2 weeks.  She states movement seems to make the pain worse.  She states when she lays down it helps, but is not completely eliminate the pain.  She is having nausea and vomiting.  She is also constipated.  Patient denies any gross hematuria.  Patient denies any fevers and chills.  KUB June 04, 2020 notes a radiopaque calcification in the left lower pole of the kidney  UA positive for > 30 WBC's, 11-30 RBC's and many bacteria.      PMH: Past Medical History:  Diagnosis Date  . Depression   . Kidney stone   . Uterine fibroid     Surgical History: Past Surgical History:  Procedure Laterality Date  . CYSTOSCOPY  11/01/2019   Procedure: CYSTOSCOPY;  Surgeon: Schermerhorn, Gwen Her, MD;  Location: [facility];  Service: Gynecology;;  . EXTRACORPOREAL SHOCK WAVE LITHOTRIPSY Right 11/21/2019   Procedure: EXTRACORPOREAL SHOCK WAVE LITHOTRIPSY (ESWL);  Surgeon: Billey Co, MD;  Location: [facility];  Service: Urology;  Laterality: Right;  . HYSTERECTOMY ABDOMINAL WITH SALPINGO-OOPHORECTOMY Bilateral 11/01/2019   Procedure: HYSTERECTOMY ABDOMINAL WITH SALPINGECTOMY;  Surgeon: Schermerhorn, Gwen Her, MD;  Location: [facility];  Service: Gynecology;  Laterality: Bilateral;    Home Medications:   Allergies as of 06/04/2020      Reactions   Amoxicillin Swelling   Hydrocodone Other (See Comments)   headache      Medication List       Accurate as of June 04, 2020 11:59 PM. If you have any questions, ask your nurse or doctor.        STOP taking these medications   escitalopram 10 MG tablet Commonly known as: Lexapro Stopped by: Zara Council, PA-C     TAKE these medications   cetirizine 10 MG tablet Commonly known as: ZYRTEC Take 10 mg by mouth daily.   estradiol 1 MG tablet Commonly known as: ESTRACE Take by mouth.   gabapentin 300 MG capsule Commonly known as: Neurontin Take 1 capsule (300 mg total) by mouth 2 (two) times daily.   ibuprofen 800 MG tablet Commonly known as: ADVIL Take 1 tablet (800 mg total) by mouth every 6 (six) hours.   nicotine 21 mg/24hr patch Commonly known as: NICODERM CQ - dosed in mg/24 hours Place onto the skin.   omeprazole 40 MG capsule Commonly known as: PRILOSEC Take by mouth.   ondansetron 4 MG tablet Commonly known as: ZOFRAN Take 1 tablet (4 mg total) by mouth every 6 (six) hours as needed for nausea.   sertraline 50 MG tablet Commonly known as: ZOLOFT Take 50 mg by mouth daily.   sulfamethoxazole-trimethoprim 800-160 MG tablet Commonly known as: BACTRIM DS Take 1 tablet by mouth every 12 (twelve) hours. Started by:  Devon Pretty, PA-C   tamsulosin 0.4 MG Caps capsule Commonly known as: FLOMAX Take 1 capsule (0.4 mg total) by mouth daily after supper.       Allergies:  Allergies  Allergen Reactions  . Amoxicillin Swelling  . Hydrocodone Other (See Comments)    headache    Family History: No family history on file.  Social History:  reports that she has been smoking cigarettes. She has been smoking about 1.00 pack per day. She has never used smokeless tobacco. She reports that she does not drink alcohol and does not use drugs.  ROS: Pertinent ROS in HPI  Physical Exam: BP 126/88   Pulse 83    Ht 5\' 4"  (1.626 m)   Wt 226 lb (102.5 kg)   LMP 08/22/2019   BMI 38.79 kg/m   Constitutional:  Well nourished. Alert and oriented, No acute distress. HEENT: [place] AT, moist mucus membranes.  Trachea midline Cardiovascular: No clubbing, cyanosis, or edema. Respiratory: Normal respiratory effort, no increased work of breathing. Neurologic: Grossly intact, no focal deficits, moving all 4 extremities. Psychiatric: Normal mood and affect.   Laboratory Data: Lab Results  Component Value Date   WBC 15.2 (H) 11/02/2019   HGB 11.5 (L) 11/02/2019   HCT 34.9 (L) 11/02/2019   MCV 97.2 11/02/2019   PLT 325 11/02/2019    Lab Results  Component Value Date   CREATININE 0.85 11/02/2019    Lab Results  Component Value Date   AST 32 11/02/2019   Lab Results  Component Value Date   ALT 31 11/02/2019   Urinalysis Component     Latest Ref Rng & Units 06/04/2020  Specific Gravity, UA     1.005 - 1.030 1.025  pH, UA     5.0 - 7.5 6.0  Color, UA     Yellow Yellow  Appearance Ur     Clear Cloudy (A)  Leukocytes,UA     Negative 1+ (A)  Protein,UA     Negative/Trace Trace (A)  Glucose, UA     Negative Negative  Ketones, UA     Negative Negative  RBC, UA     Negative 3+ (A)  Bilirubin, UA     Negative Negative  Urobilinogen, Ur     0.2 - 1.0 mg/dL 1.0  Nitrite, UA     Negative Negative  Microscopic Examination      See below:   Component     Latest Ref Rng & Units 06/04/2020  WBC, UA     0 - 5 /hpf >30 (A)  RBC     0 - 2 /hpf 11-30 (A)  Epithelial Cells (non renal)     0 - 10 /hpf 0-10  Mucus, UA     Not Estab. Present (A)  Bacteria, UA     None seen/Few Many (A)  I have reviewed the labs.   Pertinent Imaging: CLINICAL DATA:  Evaluate right renal stone. History of left flank pain.  EXAM: ABDOMEN - 1 VIEW  COMPARISON:  CT 10/30/2019 and abdominal radiograph 12/17/2019  FINDINGS: One or two small calcifications in the left kidney lower pole region.  Calcification measures roughly 4-5 mm. No definite right kidney stone but the central aspect of the right kidney is obscured by stool. No large pelvic calcifications. Lung bases are clear. Moderate stool burden in the abdomen and pelvis. Nonobstructive bowel gas pattern.  IMPRESSION: One or two small stones in left kidney lower pole. Findings are similar to the previous examination.  Electronically Signed   By: Markus Daft M.D.   On: 06/05/2020 10:31 I have independently reviewed the films.    Assessment & Plan:    1. Flank pain I attempted to get a STAT CT on the patient while she was in the office, but she left without telling anyone I was able to contact her by phone and explained to her that her urine was suspicious for infection and with her having flank pain, she could have a distal stone and it was very possible that she could become septic.  I asked if there was anyway she could get the CT today and she said she could not.  She is able to have transportation tomorrow.   We will get her scan tomorrow.  In the meantime, I have sent in an antibiotic and instructed her to start this immediately.  I also reviewed red flag signs with her (high fevers, chills, intractable pain, intractable vomiting, lightheadedness, etc.).  She voiced her understanding and was agreeable.     Return for STAT Renal stone study .  These notes generated with voice recognition software. I apologize for typographical errors.  Zara Council, PA-C  [facility] [address]  [place], [place] [address] [phone]

## 2020-06-04 ENCOUNTER — Ambulatory Visit
Admission: RE | Admit: 2020-06-04 | Discharge: 2020-06-04 | Disposition: A | Discharge status: Home / Self Care | Payer: Self-pay | Source: Ambulatory Visit | Attending: Urology | Admitting: Urology

## 2020-06-04 ENCOUNTER — Ambulatory Visit: Payer: Self-pay | Attending: Urology

## 2020-06-04 ENCOUNTER — Ambulatory Visit: Payer: Self-pay | Admitting: Urology

## 2020-06-04 ENCOUNTER — Other Ambulatory Visit: Payer: Self-pay

## 2020-06-04 ENCOUNTER — Ambulatory Visit: Payer: Self-pay

## 2020-06-04 ENCOUNTER — Ambulatory Visit (INDEPENDENT_AMBULATORY_CARE_PROVIDER_SITE_OTHER): Payer: Self-pay | Admitting: Urology

## 2020-06-04 VITALS — BP 126/88 | HR 83 | Ht 64.0 in | Wt 226.0 lb

## 2020-06-04 DIAGNOSIS — N200 Calculus of kidney: Secondary | ICD-10-CM

## 2020-06-04 DIAGNOSIS — R109 Unspecified abdominal pain: Secondary | ICD-10-CM

## 2020-06-04 DIAGNOSIS — R300 Dysuria: Secondary | ICD-10-CM

## 2020-06-04 MED ORDER — SULFAMETHOXAZOLE-TRIMETHOPRIM 800-160 MG PO TABS: 1.0000 | ORAL_TABLET | Freq: Two times a day (BID) | ORAL | 0 refills | Status: DC

## 2020-06-05 ENCOUNTER — Telehealth: Payer: Self-pay | Admitting: Urology

## 2020-06-05 LAB — URINALYSIS, COMPLETE
Bilirubin, UA: NEGATIVE
Glucose, UA: NEGATIVE
Ketones, UA: NEGATIVE
Nitrite, UA: NEGATIVE
Specific Gravity, UA: 1.025 (ref 1.005–1.030)
Urobilinogen, Ur: 1 mg/dL (ref 0.2–1.0)
pH, UA: 6 (ref 5.0–7.5)

## 2020-06-05 LAB — MICROSCOPIC EXAMINATION: WBC, UA: >30 /hpf — AB (ref 0–5)

## 2020-06-05 NOTE — Telephone Encounter (Signed)
Jennifer [name] ordered a stat CT scan for the patient on 06-04-20. I had it scheduled for same day at the [facility] @ 2:00 but the patient was unable to go due to transportation so I called and per Suezanne Jacquet he ok'd for her to come to [facility] now to have the CT scan done since she was already here in the clinic. Patient walked out of our office without saying anything. She later spoke to [facility] saying she could go the next day, We have been unable to reach her to reschedule.  Sharyn Lull

## 2020-06-07 LAB — CULTURE, URINE COMPREHENSIVE

## 2020-06-08 ENCOUNTER — Telehealth: Payer: Self-pay | Admitting: Family Medicine

## 2020-06-08 ENCOUNTER — Encounter: Payer: Self-pay | Admitting: Urology

## 2020-06-08 NOTE — Telephone Encounter (Signed)
-----   Message from Nori Riis, PA-C sent at 06/07/2020  7:38 PM EST ----- Please let Mrs. Alldredge know that her urine culture was negative for infection.  We are still needing her to have her CT scan for further evaluation.

## 2020-06-08 NOTE — Telephone Encounter (Signed)
LMOM informed patient of normal UCX. She is to contact our office if she has not heard from the imaging center.

## 2020-06-13 ENCOUNTER — Encounter: Payer: Self-pay | Admitting: Emergency Medicine

## 2020-06-13 ENCOUNTER — Emergency Department
Admission: EM | Admit: 2020-06-13 | Discharge: 2020-06-13 | Disposition: A | Discharge status: Home / Self Care | Payer: Self-pay | Attending: Emergency Medicine | Admitting: Emergency Medicine

## 2020-06-13 ENCOUNTER — Emergency Department: Payer: Self-pay

## 2020-06-13 ENCOUNTER — Other Ambulatory Visit: Payer: Self-pay

## 2020-06-13 DIAGNOSIS — R0602 Shortness of breath: Secondary | ICD-10-CM | POA: Insufficient documentation

## 2020-06-13 DIAGNOSIS — Z5321 Procedure and treatment not carried out due to patient leaving prior to being seen by health care provider: Secondary | ICD-10-CM | POA: Insufficient documentation

## 2020-06-13 LAB — CBC
HCT: 42.5 % (ref 36.0–46.0)
Hemoglobin: 14 g/dL (ref 12.0–15.0)
MCH: 31.3 pg (ref 26.0–34.0)
MCHC: 32.9 g/dL (ref 30.0–36.0)
MCV: 95.1 fL (ref 80.0–100.0)
Platelets: 279 10*3/uL (ref 150–400)
RBC: 4.47 MIL/uL (ref 3.87–5.11)
RDW: 13.2 % (ref 11.5–15.5)
WBC: 5.6 10*3/uL (ref 4.0–10.5)
nRBC: 0 % (ref 0.0–0.2)

## 2020-06-13 LAB — BASIC METABOLIC PANEL
Anion gap: 11 (ref 5–15)
BUN: 20 mg/dL (ref 6–20)
CO2: 23 mmol/L (ref 22–32)
Calcium: 8.9 mg/dL (ref 8.9–10.3)
Chloride: 104 mmol/L (ref 98–111)
Creatinine, Ser: 0.93 mg/dL (ref 0.44–1.00)
GFR, Estimated: >60 mL/min (ref >60)
Glucose, Bld: 99 mg/dL (ref 70–99)
Potassium: 4 mmol/L (ref 3.5–5.1)
Sodium: 138 mmol/L (ref 135–145)

## 2020-06-13 NOTE — ED Triage Notes (Signed)
Pt arrived via POV with reports of getting short of breath while talking to someone while she was waiting for the bus.  Pt states she has been "coughing up green stuff" for the past year.  Denies any pain at this time.  When patient talks she is able to talk in complete sentences without running out of breath.  Pt was brought in by neighbor.

## 2020-06-13 NOTE — ED Triage Notes (Signed)
Unable to reassess pts vitals at this time due to pt being in x-ray

## 2020-06-13 NOTE — ED Triage Notes (Signed)
Pt called from [place] for VS reassessment and to be placed in room, no response

## 2020-06-14 NOTE — ED Notes (Signed)
Pt called x's 3, no response ?

## 2020-07-01 ENCOUNTER — Ambulatory Visit: Admission: RE | Admit: 2020-07-01 | Payer: Self-pay | Source: Ambulatory Visit

## 2020-07-20 ENCOUNTER — Encounter: Payer: Self-pay | Admitting: Urology

## 2020-07-29 ENCOUNTER — Encounter: Payer: Self-pay | Admitting: Urology

## 2020-09-28 ENCOUNTER — Ambulatory Visit (INDEPENDENT_AMBULATORY_CARE_PROVIDER_SITE_OTHER): Payer: Self-pay | Admitting: Physician Assistant

## 2020-09-28 ENCOUNTER — Encounter: Payer: Self-pay | Admitting: Physician Assistant

## 2020-09-28 ENCOUNTER — Ambulatory Visit
Admission: RE | Admit: 2020-09-28 | Discharge: 2020-09-28 | Disposition: A | Discharge status: Home / Self Care | Payer: Self-pay | Source: Ambulatory Visit | Attending: Physician Assistant | Admitting: Physician Assistant

## 2020-09-28 ENCOUNTER — Ambulatory Visit
Admission: RE | Admit: 2020-09-28 | Discharge: 2020-09-28 | Disposition: A | Discharge status: Home / Self Care | Payer: Self-pay | Attending: Physician Assistant | Admitting: Physician Assistant

## 2020-09-28 ENCOUNTER — Other Ambulatory Visit: Payer: Self-pay

## 2020-09-28 VITALS — BP 122/72 | HR 106 | Temp 98.1°F | Ht 64.0 in | Wt 209.0 lb

## 2020-09-28 DIAGNOSIS — R110 Nausea: Secondary | ICD-10-CM

## 2020-09-28 DIAGNOSIS — K5900 Constipation, unspecified: Secondary | ICD-10-CM

## 2020-09-28 DIAGNOSIS — R10A0 Flank pain, unspecified side: Secondary | ICD-10-CM

## 2020-09-28 DIAGNOSIS — N200 Calculus of kidney: Secondary | ICD-10-CM

## 2020-09-28 DIAGNOSIS — Z87442 Personal history of urinary calculi: Secondary | ICD-10-CM

## 2020-09-28 DIAGNOSIS — R109 Unspecified abdominal pain: Secondary | ICD-10-CM

## 2020-09-28 MED ORDER — ONDANSETRON HCL 4 MG PO TABS: 4.0000 mg | ORAL_TABLET | Freq: Four times a day (QID) | ORAL | 0 refills | Status: DC | PRN

## 2020-09-28 MED ORDER — CIPROFLOXACIN HCL 500 MG PO TABS: 500.0000 mg | ORAL_TABLET | Freq: Two times a day (BID) | ORAL | 0 refills | Status: AC

## 2020-09-28 NOTE — Progress Notes (Signed)
09/28/2020 4:36 PM   Jennifer [name] 02/09/1976 [id number]  CC: Chief Complaint  Patient presents with  . Nephrolithiasis    HPI: Jennifer [name] is a 45 y.o. female with PMH nephrolithiasis who presents today for evaluation of possible UTI versus acute stone episode.  She was seen in clinic most recently by Zara Council on 06/04/2020 for the same.  She was recommended to undergo CT stone study at that time, however she left clinic abruptly during that visit.  She did not complete CT as recommended thereafter.  Today she reports an approximate 1 month history of bilateral flank pain, R>L, nausea, anorexia, body aches, chills, dysuria, urgency, frequency, and subjective fever.  She denies vomiting.  She took a Covid test at home which was negative.  She has been taking ibuprofen for management of her symptoms, most recently this morning around 4 AM.  Additionally, she reports distress over possible recurrent UTIs and wonders what may be contributing to this.  She does have a history of chronic constipation.  Lastly, she requests guidance on dietary changes for kidney stone prevention.  Notably, she reports she is unable to pursue CT today due to transportation issues.  She states she will do this in the coming days.  She underwent KUB today with a stable left lower pole stone and no evidence of ureteral calculi.  In-office UA today positive for trace ketones, 2+ blood, 2+ protein, and 1+ leukocyte esterase; urine microscopy with >30 WBCs/HPF, 3-10 RBCs/HPF, >10 epithelial cells/hpf and many bacteria.   PMH: Past Medical History:  Diagnosis Date  . Depression   . Kidney stone   . Uterine fibroid     Surgical History: Past Surgical History:  Procedure Laterality Date  . CYSTOSCOPY  11/01/2019   Procedure: CYSTOSCOPY;  Surgeon: Schermerhorn, Gwen Her, MD;  Location: [facility];  Service: Gynecology;;  . EXTRACORPOREAL SHOCK WAVE LITHOTRIPSY Right 11/21/2019   Procedure:  EXTRACORPOREAL SHOCK WAVE LITHOTRIPSY (ESWL);  Surgeon: Billey Co, MD;  Location: [facility];  Service: Urology;  Laterality: Right;  . HYSTERECTOMY ABDOMINAL WITH SALPINGO-OOPHORECTOMY Bilateral 11/01/2019   Procedure: HYSTERECTOMY ABDOMINAL WITH SALPINGECTOMY;  Surgeon: Schermerhorn, Gwen Her, MD;  Location: [facility];  Service: Gynecology;  Laterality: Bilateral;    Home Medications:  Allergies as of 09/28/2020      Reactions   Amoxicillin Swelling   Hydrocodone Other (See Comments)   headache      Medication List       Accurate as of September 28, 2020  4:36 PM. If you have any questions, ask your nurse or doctor.        STOP taking these medications   sulfamethoxazole-trimethoprim 800-160 MG tablet Commonly known as: BACTRIM DS Stopped by: Debroah Loop, PA-C     TAKE these medications   cetirizine 10 MG tablet Commonly known as: ZYRTEC Take 10 mg by mouth daily.   ciprofloxacin 500 MG tablet Commonly known as: Cipro Take 1 tablet (500 mg total) by mouth 2 (two) times daily for 7 days. Started by: Debroah Loop, PA-C   estradiol 1 MG tablet Commonly known as: ESTRACE Take by mouth.   gabapentin 300 MG capsule Commonly known as: Neurontin Take 1 capsule (300 mg total) by mouth 2 (two) times daily.   ibuprofen 800 MG tablet Commonly known as: ADVIL Take 1 tablet (800 mg total) by mouth every 6 (six) hours.   nicotine 21 mg/24hr patch Commonly known as: NICODERM CQ - dosed in mg/24 hours Place onto  the skin.   omeprazole 40 MG capsule Commonly known as: PRILOSEC Take by mouth.   ondansetron 4 MG tablet Commonly known as: ZOFRAN Take 1 tablet (4 mg total) by mouth every 6 (six) hours as needed for nausea.   sertraline 50 MG tablet Commonly known as: ZOLOFT Take 50 mg by mouth daily.   tamsulosin 0.4 MG Caps capsule Commonly known as: FLOMAX Take 1 capsule (0.4 mg total) by mouth daily after supper.       Allergies:  Allergies   Allergen Reactions  . Amoxicillin Swelling  . Hydrocodone Other (See Comments)    headache    Family History: No family history on file.  Social History:   reports that she has been smoking cigarettes. She has been smoking about 1.00 pack per day. She has never used smokeless tobacco. She reports that she does not drink alcohol and does not use drugs.  Physical Exam: BP 122/72   Pulse (!) 106   Temp 98.1 F (36.7 C) (Oral)   Ht 5\' 4"  (1.626 m)   Wt 209 lb (94.8 kg)   LMP 08/22/2019   BMI 35.87 kg/m   Constitutional:  Alert and oriented, no acute distress, nontoxic appearing HEENT: [place], AT Cardiovascular: No clubbing, cyanosis, or edema Respiratory: Normal respiratory effort, no increased work of breathing GU: No CVA tenderness Skin: No rashes, bruises or suspicious lesions Neurologic: Grossly intact, no focal deficits, moving all 4 extremities Psychiatric: Tearful mood and affect  Laboratory Data: Results for orders placed or performed in visit on 09/28/20  Microscopic Examination   Urine  Result Value Ref Range   WBC, UA >30 (A) 0 - 5 /hpf   RBC 3-10 (A) 0 - 2 /hpf   Epithelial Cells (non renal) >10 (A) 0 - 10 /hpf   Renal Epithel, UA 0-10 (A) None seen /hpf   Bacteria, UA Many (A) None seen/Few  Urinalysis, Complete  Result Value Ref Range   Specific Gravity, UA 1.020 1.005 - 1.030   pH, UA 5.5 5.0 - 7.5   Color, UA Yellow Yellow   Appearance Ur Cloudy (A) Clear   Leukocytes,UA 1+ (A) Negative   Protein,UA 2+ (A) Negative/Trace   Glucose, UA Negative Negative   Ketones, UA Trace (A) Negative   RBC, UA 2+ (A) Negative   Bilirubin, UA Negative Negative   Urobilinogen, Ur 0.2 0.2 - 1.0 mg/dL   Nitrite, UA Negative Negative   Microscopic Examination See below:    Pertinent Imaging: KUB, 09/28/2020: CLINICAL DATA:  Kidney stones.  Patient reports right-sided pain.  EXAM: ABDOMEN - 1 VIEW  COMPARISON:  Most recent abdominal radiograph 06/04/2020.  Most recent CT 10/30/2019  FINDINGS: 6 mm stone projects over the lower left renal shadow, not significantly changed from prior exam. No definitive right renal calculi. No stones over the course of the ureters or in the pelvis. Moderate stool in the right colon, small volume of stool distally. No bowel dilatation to suggest obstruction. Lower most lung bases are clear. No acute osseous abnormalities are seen.  IMPRESSION: Unchanged 6 mm stone projects over the lower left renal shadow. No radiographic evidence of right urolithiasis.   Electronically Signed   By: Keith Rake M.D.   On: 09/29/2020 13:01  I personally reviewed the images referenced above and note stable appearing left renal stone without evidence of ureteral calculi.  Assessment & Plan:   1. Flank pain VSS, no CVA tenderness, KUB reassuring for acute stone episode, and UA more  consistent with UTI than acute stone episode.  Will start empiric Cipro for possible early pyelonephritis and send for culture for further evaluation.  Unable to obtain CT stone study today per patient report.  I placed an order for this today and counseled her to obtain the scan ASAP.  She expressed understanding.  We also discussed return precautions today including fever, uncontrollable pain, or uncontrollable nausea/vomiting.  Patient expressed understanding. - Urinalysis, Complete - CULTURE, URINE COMPREHENSIVE - ciprofloxacin (CIPRO) 500 MG tablet; Take 1 tablet (500 mg total) by mouth 2 (two) times daily for 7 days.  Dispense: 14 tablet; Refill: 0 - CT RENAL STONE STUDY; Future  2. Nausea We will treat with Zofran per patient request. - ondansetron (ZOFRAN) 4 MG tablet; Take 1 tablet (4 mg total) by mouth every 6 (six) hours as needed for nausea.  Dispense: 20 tablet; Refill: 0  3. Constipation, unspecified constipation type Possibly contributory to UTIs.  Counseled patient to start MiraLAX to produce formed, easy to pass bowel  movements.  She expressed understanding.  4. History of nephrolithiasis Provided written resources on kidney stone diet today.  Return if symptoms worsen or fail to improve, for Will call with results.  Debroah Loop, PA-C  [facility] [address] [place], [place] [address] [phone]

## 2020-09-28 NOTE — Patient Instructions (Addendum)
1. Start Cipro for treatment of your UTI. 2. Start Miralax for management of your constipation. Take one capful of powder daily mixed into a liquid of your choice. You may adjust the dose to produce formed bowel movements that are easy to pass. 3. The imaging department will contact you to schedule your CT scan. 4. If you develop fever, uncontrollable pain, or uncontrollable nausea/vomiting, please proceed to the Emergency Department immediately.   Textbook of Natural Medicine (5th ed., pp. [phone]). [place], [place]: Elsevier.">  Dietary Guidelines to Help Prevent Kidney Stones Kidney stones are deposits of minerals and salts that form inside your kidneys. Your risk of developing kidney stones may be greater depending on your diet, your lifestyle, the medicines you take, and whether you have certain medical conditions. Most people can lower their chances of developing kidney stones by following the instructions below. Your dietitian may give you more specific instructions depending on your overall health and the type of kidney stones you tend to develop. What are tips for following this plan? Reading food labels  Choose foods with "no salt added" or "low-salt" labels. Limit your salt (sodium) intake to less than 1,500 mg a day.  Choose foods with calcium for each meal and snack. Try to eat about 300 mg of calcium at each meal. Foods that contain 200-500 mg of calcium a serving include: ? 8 oz (237 mL) of milk, calcium-fortifiednon-dairy milk, and calcium-fortifiedfruit juice. Calcium-fortified means that calcium has been added to these drinks. ? 8 oz (237 mL) of kefir, yogurt, and soy yogurt. ? 4 oz (114 g) of tofu. ? 1 oz (28 g) of cheese. ? 1 cup (150 g) of dried figs. ? 1 cup (91 g) of cooked broccoli. ? One 3 oz (85 g) can of sardines or mackerel. Most people need 1,000-1,500 mg of calcium a day. Talk to your dietitian about how much calcium is recommended for you.   Shopping  Buy  plenty of fresh fruits and vegetables. Most people do not need to avoid fruits and vegetables, even if these foods contain nutrients that may contribute to kidney stones.  When shopping for convenience foods, choose: ? Whole pieces of fruit. ? Pre-made salads with dressing on the side. ? Low-fat fruit and yogurt smoothies.  Avoid buying frozen meals or prepared deli foods. These can be high in sodium.  Look for foods with live cultures, such as yogurt and kefir.  Choose high-fiber grains, such as whole-wheat breads, oat bran, and wheat cereals. Cooking  Do not add salt to food when cooking. Place a salt shaker on the table and allow each person to add his or her own salt to taste.  Use vegetable protein, such as beans, textured vegetable protein (TVP), or tofu, instead of meat in pasta, casseroles, and soups. Meal planning  Eat less salt, if told by your dietitian. To do this: ? Avoid eating processed or pre-made food. ? Avoid eating fast food.  Eat less animal protein, including cheese, meat, poultry, or fish, if told by your dietitian. To do this: ? Limit the number of times you have meat, poultry, fish, or cheese each week. Eat a diet free of meat at least 2 days a week. ? Eat only one serving each day of meat, poultry, fish, or seafood. ? When you prepare animal protein, cut pieces into small portion sizes. For most meat and fish, one serving is about the size of the palm of your hand.  Eat at least five  servings of fresh fruits and vegetables each day. To do this: ? Keep fruits and vegetables on hand for snacks. ? Eat one piece of fruit or a handful of berries with breakfast. ? Have a salad and fruit at lunch. ? Have two kinds of vegetables at dinner.  Limit foods that are high in a substance called oxalate. These include: ? Spinach (cooked), rhubarb, beets, sweet potatoes, and Swiss chard. ? Peanuts. ? Potato chips, french fries, and baked potatoes with skin on. ? Nuts and  nut products. ? Chocolate.  If you regularly take a diuretic medicine, make sure to eat at least 1 or 2 servings of fruits or vegetables that are high in potassium each day. These include: ? Avocado. ? Banana. ? Orange, prune, carrot, or tomato juice. ? Baked potato. ? Cabbage. ? Beans and split peas. Lifestyle  Drink enough fluid to keep your urine pale yellow. This is the most important thing you can do. Spread your fluid intake throughout the day.  If you drink alcohol: ? Limit how much you use to:  0-1 drink a day for women who are not pregnant.  0-2 drinks a day for men. ? Be aware of how much alcohol is in your drink. In the [place] one drink equals one 12 oz bottle of beer (355 mL), one 5 oz glass of wine (148 mL), or one 1 oz glass of hard liquor (44 mL).  Lose weight if told by your health care provider. Work with your dietitian to find an eating plan and weight loss strategies that work best for you.   General information  Talk to your health care provider and dietitian about taking daily supplements. You may be told the following depending on your health and the cause of your kidney stones: ? Not to take supplements with vitamin C. ? To take a calcium supplement. ? To take a daily probiotic supplement. ? To take other supplements such as magnesium, fish oil, or vitamin B6.  Take over-the-counter and prescription medicines only as told by your health care provider. These include supplements. What foods should I limit? Limit your intake of the following foods, or eat them as told by your dietitian. Vegetables Spinach. Rhubarb. Beets. Canned vegetables. Angie Fava. Olives. Baked potatoes with skin. Grains Wheat bran. Baked goods. Salted crackers. Cereals high in sugar. Meats and other proteins Nuts. Nut butters. Large portions of meat, poultry, or fish. Salted, precooked, or cured meats, such as sausages, meat loaves, and hot dogs. Dairy Cheese. Beverages Regular soft  drinks. Regular vegetable juice. Seasonings and condiments Seasoning blends with salt. Salad dressings. Soy sauce. Ketchup. Barbecue sauce. Other foods Canned soups. Canned pasta sauce. Casseroles. Pizza. Lasagna. Frozen meals. Potato chips. [place] fries. The items listed above may not be a complete list of foods and beverages you should limit. Contact a dietitian for more information. What foods should I avoid? Talk to your dietitian about specific foods you should avoid based on the type of kidney stones you have and your overall health. Fruits Grapefruit. The item listed above may not be a complete list of foods and beverages you should avoid. Contact a dietitian for more information. Summary  Kidney stones are deposits of minerals and salts that form inside your kidneys.  You can lower your risk of kidney stones by making changes to your diet.  The most important thing you can do is drink enough fluid. Drink enough fluid to keep your urine pale yellow.  Talk to your dietitian  about how much calcium you should have each day, and eat less salt and animal protein as told by your dietitian. This information is not intended to replace advice given to you by your health care provider. Make sure you discuss any questions you have with your health care provider. Document Revised: 06/06/2019 Document Reviewed: 06/06/2019 Elsevier Patient Education  2021 [employer].

## 2020-09-29 LAB — URINALYSIS, COMPLETE
Bilirubin, UA: NEGATIVE
Glucose, UA: NEGATIVE
Nitrite, UA: NEGATIVE
Specific Gravity, UA: 1.02 (ref 1.005–1.030)
Urobilinogen, Ur: 0.2 mg/dL (ref 0.2–1.0)
pH, UA: 5.5 (ref 5.0–7.5)

## 2020-09-29 LAB — MICROSCOPIC EXAMINATION
Epithelial Cells (non renal): >10 /hpf — AB (ref 0–10)
WBC, UA: >30 /hpf — AB (ref 0–5)

## 2020-10-02 ENCOUNTER — Other Ambulatory Visit: Payer: Self-pay | Admitting: Physician Assistant

## 2020-10-02 DIAGNOSIS — R3129 Other microscopic hematuria: Secondary | ICD-10-CM

## 2020-10-02 DIAGNOSIS — R109 Unspecified abdominal pain: Secondary | ICD-10-CM

## 2020-10-02 LAB — CULTURE, URINE COMPREHENSIVE

## 2020-10-26 ENCOUNTER — Other Ambulatory Visit: Payer: Self-pay

## 2020-10-26 ENCOUNTER — Ambulatory Visit
Admission: RE | Admit: 2020-10-26 | Discharge: 2020-10-26 | Disposition: A | Discharge status: Home / Self Care | Payer: Self-pay | Source: Ambulatory Visit | Attending: Physician Assistant | Admitting: Physician Assistant

## 2020-10-26 DIAGNOSIS — R109 Unspecified abdominal pain: Secondary | ICD-10-CM | POA: Insufficient documentation

## 2020-10-26 DIAGNOSIS — R3129 Other microscopic hematuria: Secondary | ICD-10-CM | POA: Insufficient documentation

## 2020-10-26 MED ORDER — IOHEXOL 300 MG/ML  SOLN
150.0000 mL | Freq: Once | INTRAMUSCULAR | Status: AC | PRN
  Administered 2020-10-26: 150 mL via INTRAVENOUS

## 2020-10-27 NOTE — Telephone Encounter (Signed)
Appt made for CTU results with Dr Erlene Quan for 5/10. Patient confirmed.

## 2020-10-27 NOTE — Telephone Encounter (Signed)
-----   Message from Debroah Loop, [place] sent at 10/27/2020  3:42 PM EDT ----- Please schedule her for cysto and CTU results with Dr. Erlene Quan ASAP. ----- Message ----- From: Interface, Rad Results In Sent: 10/26/2020   5:14 PM EDT To: Debroah Loop, PA-C

## 2020-11-03 ENCOUNTER — Other Ambulatory Visit: Payer: Self-pay

## 2020-11-03 ENCOUNTER — Ambulatory Visit (INDEPENDENT_AMBULATORY_CARE_PROVIDER_SITE_OTHER): Payer: Self-pay | Admitting: Urology

## 2020-11-03 ENCOUNTER — Encounter: Payer: Self-pay | Admitting: Urology

## 2020-11-03 VITALS — BP 144/78 | HR 73 | Ht 64.0 in | Wt 216.0 lb

## 2020-11-03 DIAGNOSIS — N1330 Unspecified hydronephrosis: Secondary | ICD-10-CM

## 2020-11-03 DIAGNOSIS — R3129 Other microscopic hematuria: Secondary | ICD-10-CM

## 2020-11-03 DIAGNOSIS — N200 Calculus of kidney: Secondary | ICD-10-CM

## 2020-11-03 NOTE — Progress Notes (Signed)
11/03/2020 2:50 PM   Jennifer [name] Jan 06, 1976 [id number]  Referring provider: Donnie Coffin, MD [place],  [place] [address]  Chief Complaint  Patient presents with  . Results    HPI: 45 year old female who presents today for further evaluation/discussion of CT urogram results.  Notably, over the past several months, she has been experiencing some nonspecific midline pain, nausea and suspicious appearing urinalyses.  CT urogram performed on 10/27/2018 shows new moderate right hydronephrosis and a possible stricture at the UPJ along with bilateral nonobstructing stones and chronic left renal scarring.  Notably, there is a questionable defect at this location.    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  This was seen on CT scan back in 10/2019 at that point in time, there was suspicion of urothelial thickening within the right renal pelvis as well.  Have some intermittent flank pain.  No gross hematuria.  No fevers or chills.  UA today appears persistently suspicious with greater than 30 RBCs and WBCs, moderate bacteria, nitrate negative but similar to previous urinalyses.  She denies any bladder symptoms.  Current and ongoing smoker.  PMH: Past Medical History:  Diagnosis Date  . Depression   . Kidney stone   . Uterine fibroid     Surgical History: Past Surgical History:  Procedure Laterality Date  . CYSTOSCOPY  11/01/2019   Procedure: CYSTOSCOPY;  Surgeon: Schermerhorn, Gwen Her, MD;  Location: [facility] ORS;  Service: Gynecology;;  . EXTRACORPOREAL SHOCK WAVE LITHOTRIPSY Right 11/21/2019   Procedure: EXTRACORPOREAL SHOCK WAVE LITHOTRIPSY (ESWL);  Surgeon: Billey Co, MD;  Location: [facility] ORS;  Service: Urology;  Laterality: Right;  . HYSTERECTOMY ABDOMINAL WITH SALPINGO-OOPHORECTOMY Bilateral 11/01/2019   Procedure: HYSTERECTOMY ABDOMINAL WITH SALPINGECTOMY;  Surgeon: Schermerhorn, Gwen Her, MD;  Location: [facility] ORS;   Service: Gynecology;  Laterality: Bilateral;    Home Medications:  Allergies as of 11/03/2020      Reactions   Amoxicillin Swelling   Hydrocodone Other (See Comments)   headache      Medication List       Accurate as of Nov 03, 2020  2:50 PM. If you have any questions, ask your nurse or doctor.        STOP taking these medications   estradiol 1 MG tablet Commonly known as: ESTRACE Stopped by: Hollice Espy, MD   nicotine 21 mg/24hr patch Commonly known as: NICODERM CQ - dosed in mg/24 hours Stopped by: Hollice Espy, MD   tamsulosin 0.4 MG Caps capsule Commonly known as: FLOMAX Stopped by: Hollice Espy, MD     TAKE these medications   cetirizine 10 MG tablet Commonly known as: ZYRTEC Take 10 mg by mouth daily.   gabapentin 300 MG capsule Commonly known as: Neurontin Take 1 capsule (300 mg total) by mouth 2 (two) times daily.   ibuprofen 800 MG tablet Commonly known as: ADVIL Take 1 tablet (800 mg total) by mouth every 6 (six) hours.   omeprazole 40 MG capsule Commonly known as: PRILOSEC Take by mouth.   ondansetron 4 MG tablet Commonly known as: ZOFRAN Take 1 tablet (4 mg total) by mouth every 6 (six) hours as needed for nausea.   sertraline 50 MG tablet Commonly known as: ZOLOFT Take 50 mg by mouth daily.       Allergies:  Allergies  Allergen Reactions  . Amoxicillin Swelling  . Hydrocodone Other (See Comments)    headache  Family History: No family history on file.  Social History:  reports that she has been smoking cigarettes. She has been smoking about 1.00 pack per day. She has never used smokeless tobacco. She reports that she does not drink alcohol and does not use drugs.   Physical Exam: BP (!) 144/78   Pulse 73   Ht 5\' 4"  (1.626 m)   Wt 216 lb (98 kg)   LMP 08/22/2019   BMI 37.08 kg/m   Constitutional:  Alert and oriented, No acute distress. HEENT: [place] AT, moist mucus membranes.  Trachea midline, no  masses. Cardiovascular: No clubbing, cyanosis, or edema. Respiratory: Normal respiratory effort, no increased work of breathing. Skin: No rashes, bruises or suspicious lesions. Neurologic: Grossly intact, no focal deficits, moving all 4 extremities. Psychiatric: Normal mood and affect.  Laboratory Data: Lab Results  Component Value Date   WBC 5.6 06/13/2020   HGB 14.0 06/13/2020   HCT 42.5 06/13/2020   MCV 95.1 06/13/2020   PLT 279 06/13/2020    Lab Results  Component Value Date   CREATININE 0.93 06/13/2020    Urinalysis Results for orders placed or performed in visit on 11/03/20  CULTURE, URINE COMPREHENSIVE   Specimen: Urine   UR  Result Value Ref Range   Urine Culture, Comprehensive Final report    Organism ID, Bacteria Comment   Microscopic Examination   Urine  Result Value Ref Range   WBC, UA >30 (A) 0 - 5 /hpf   RBC >30 (A) 0 - 2 /hpf   Epithelial Cells (non renal) 0-10 0 - 10 /hpf   Bacteria, UA Moderate (A) None seen/Few  Urinalysis, Complete  Result Value Ref Range   Specific Gravity, UA >1.030 (H) 1.005 - 1.030   pH, UA 5.5 5.0 - 7.5   Color, UA Yellow Yellow   Appearance Ur Cloudy (A) Clear   Leukocytes,UA 1+ (A) Negative   Protein,UA 1+ (A) Negative/Trace   Glucose, UA Negative Negative   Ketones, UA Negative Negative   RBC, UA 3+ (A) Negative   Bilirubin, UA Negative Negative   Urobilinogen, Ur 0.2 0.2 - 1.0 mg/dL   Nitrite, UA Negative Negative   Microscopic Examination See below:     Pertinent Imaging: Results for orders placed during the hospital encounter of 10/26/20  CT HEMATURIA WORKUP  Narrative CLINICAL DATA:  Recurrent left flank pain for 6 months. Microscopic hematuria.  EXAM: CT ABDOMEN AND PELVIS WITHOUT AND WITH CONTRAST  TECHNIQUE: Multidetector CT imaging of the abdomen and pelvis was performed following the standard protocol before and following the bolus administration of intravenous contrast.  CONTRAST:  144mL  OMNIPAQUE IOHEXOL 300 MG/ML  SOLN  COMPARISON:  Noncontrast CT on 10/30/2019  FINDINGS: Lower Chest: No acute findings.  Hepatobiliary: Moderate diffuse hepatic steatosis again noted. No hepatic masses identified. Gallbladder is unremarkable. No evidence of biliary ductal dilatation.  Pancreas:  No mass or inflammatory changes.  Spleen: Within normal limits in size and appearance.  Adrenals/Urinary Tract: No adrenal masses identified. Multiple tiny renal calculi are seen bilaterally, largest in lower pole of left kidney measuring 4 mm. No evidence of left-sided hydronephrosis.  Mild to moderate right hydronephrosis is seen which is new since previous study, with a stricture seen at the right UPJ. No definite mass is visualized at this site, however a small urothelial carcinoma cannot definitely be excluded. No evidence of ureteral calculi. No renal parenchymal masses identified. Left renal parenchymal scarring again noted. Unremarkable unopacified urinary bladder.  Stomach/Bowel:  No evidence of obstruction, inflammatory process or abnormal fluid collections. Normal appendix visualized.  Vascular/Lymphatic: No pathologically enlarged lymph nodes. No acute vascular findings.  Reproductive: Prior hysterectomy noted. Adnexal regions are unremarkable in appearance.  Other:  None.  Musculoskeletal:  No suspicious bone lesions identified.  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 No definite mass is visualized at this site, however a small urothelial carcinoma cannot definitely be excluded. Consider retrograde ureteroscopy for further evaluation.  Bilateral nonobstructing renal calculi, and chronic left renal parenchymal scarring.  Stable moderate hepatic steatosis.   Electronically Signed By: Marlaine Hind M.D. On: 10/26/2020 17:12  CT urogram results reviewed.  I also personally reviewed the imaging.  Agree with  radiologic interpretation.  There is a good chance that this hydronephrosis is chronic or may be associated with stricture versus UPJ obstruction.  Urothelial carcinoma also remains in the differential diagnosis.  Assessment & Plan:    1. Microscopic hematuria Plan for cysto in OR for work up/ treatment of #2 and #3 - Urinalysis, Complete - CULTURE, URINE COMPREHENSIVE  2. Kidney stones We will can consider treating right-sided stones at the time of #3  3. Hydronephrosis of right kidney Right hydronephrosis with possible stricture versus UPJ obstruction versus urothelial lesion at right renal pelvis  Previous images were reviewed is that she had some mild hydronephrosis chronically thus this may somewhat chronic issue related to stone disease  I recommended proceeding to the operating room for cystoscopy, right retrograde pyelogram, right diagnostic ureteroscopy with possible biopsy, possible balloon dilation, possible laser lithotripsy and ureteral stent placement for both diagnostic and possibly therapeutic purposes.  She is agreeable this plan.  We discussed the risk and benefits including risk of bleeding, infection, damage surrounding structures, stent discomfort, need for further procedures amongst others.  She is agreeable.  Preoperative UA/urine culture.   Hollice Espy, MD  [facility] [address] [place], [place] [address] [phone]

## 2020-11-03 NOTE — Patient Instructions (Signed)
Ureteroscopy Ureteroscopy is a procedure to check for and treat problems inside part of the urinary tract. In this procedure, a thin, flexible tube with a light at the end (ureteroscope) is used to look at the inside of the kidneys and the ureters. The ureters are the tubes that carry urine from the kidneys to the bladder. The ureteroscope is inserted into one or both of the ureters. You may need this procedure if you have frequent urinary tract infections (UTIs), blood in your urine, or a stone in one of your ureters. A ureteroscopy can be done:  To find the cause of urine blockage in a ureter and to evaluate other abnormalities inside the ureters or kidneys.  To remove stones.  To remove or treat growths of tissue (polyps), abnormal tissue, and some types of tumors.  To remove a tissue sample and check it for disease under a microscope (biopsy). Tell a health care provider about:  Any allergies you have.  All medicines you are taking, including vitamins, herbs, eye drops, creams, and over-the-counter medicines.  Any problems you or family members have had with anesthetic medicines.  Any blood disorders you have.  Any surgeries you have had.  Any medical conditions you have.  Whether you are pregnant or may be pregnant. What are the risks? Generally, this is a safe procedure. However, problems may occur, including:  Bleeding.  Infection.  Allergic reactions to medicines.  Scarring that narrows the ureter (stricture).  Creating a hole in the ureter (perforation). What happens before the procedure? Staying hydrated Follow instructions from your health care provider about hydration, which may include:  Up to 2 hours before the procedure - you may continue to drink clear liquids, such as water, clear fruit juice, black coffee, and plain tea.   Eating and drinking restrictions Follow instructions from your health care provider about eating and drinking, which may include:  8  hours before the procedure - stop eating heavy meals or foods, such as meat, fried foods, or fatty foods.  6 hours before the procedure - stop eating light meals or foods, such as toast or cereal.  6 hours before the procedure - stop drinking milk or drinks that contain milk.  2 hours before the procedure - stop drinking clear liquids. Medicines Ask your health care provider about:  Changing or stopping your regular medicines. This is especially important if you are taking diabetes medicines or blood thinners.  Taking medicines such as aspirin and ibuprofen. These medicines can thin your blood. Do not take these medicines unless your health care provider tells you to take them.  Taking over-the-counter medicines, vitamins, herbs, and supplements. General instructions  Do not use any products that contain nicotine or tobacco for at least 4 weeks before the procedure. These products include cigarettes, e-cigarettes, and chewing tobacco. If you need help quitting, ask your health care provider.  You may have a urine sample taken to check for infection.  Plan to have someone take you home from the hospital or clinic.  If you will be going home right after the procedure, plan to have someone with you for 24 hours.  Ask your health care provider what steps will be taken to help prevent infection. These may include: ? Washing skin with a germ-killing soap. ? Receiving antibiotic medicine. What happens during the procedure?  An IV will be inserted into one of your veins.  You will be given one or more of the following: ? A medicine to help  you relax (sedative). ? A medicine to make you fall asleep (general anesthetic). ? A medicine that is injected into your spine to numb the area below and slightly above the injection site (spinal anesthetic).  The part of your body that drains urine from your bladder (urethra) will be cleaned with a germ-killing solution.  The ureteroscope will be  passed through your urethra into your bladder.  A salt-water solution will be sent through the ureteroscope to fill your bladder. This will help the health care provider see the openings of your ureters more clearly.  The ureteroscope will be passed into your ureter. ? If a growth is found, a biopsy may be done. ? If a stone is found, it may be removed through the ureteroscope, or the stone may be broken up using a laser, shock waves, or electrical energy. ? In some cases, if the ureter is too small, a tube may be inserted that keeps the ureter open (ureteral stent). The stent may be left in place for 1 or 2 weeks to keep the ureter open, and then the ureteroscopy procedure will be done.  The scope will be removed, and your bladder will be emptied. The procedure may vary among health care providers and hospitals.   What can I expect after the procedure? After your procedure, it is common to have:  Your blood pressure, heart rate, breathing rate, and blood oxygen level monitored until you leave the hospital or clinic.  A burning sensation when you urinate. You may be asked to urinate.  Blood in your urine.  Mild discomfort in your bladder area or kidney area when urinating.  A need to urinate more often or urgently. Follow these instructions at home: Medicines  Take over-the-counter and prescription medicines only as told by your health care provider.  If you were prescribed an antibiotic medicine, take it as told by your health care provider. Do not stop taking the antibiotic even if you start to feel better. General instructions  If you were given a sedative during the procedure, it can affect you for several hours. Do not drive or operate machinery until your health care provider says that it is safe.  To relieve burning, take a warm bath or hold a warm washcloth over your groin.  Drink enough fluid to keep your urine pale yellow. ? Drink two 8-ounce (237 mL) glasses of water  every hour for the first 2 hours after you get home. ? Continue to drink water often at home.  You can eat what you normally do.  Keep all follow-up visits as told by your health care provider. This is important. ? If you had a ureteral stent placed, ask your health care provider when you need to return to have it removed.   Contact a health care provider if you have:  Chills or a fever.  Burning pain for longer than 24 hours after the procedure.  Blood in your urine for longer than 24 hours after the procedure. Get help right away if you have:  Large amounts of blood in your urine.  Blood clots in your urine.  Severe pain.  Chest pain or trouble breathing.  The feeling of a full bladder and you are unable to urinate. These symptoms may represent a serious problem that is an emergency. Do not wait to see if the symptoms will go away. Get medical help right away. Call your local emergency services (911 in the [place] Summary  Ureteroscopy is a procedure to  check for and treat problems inside part of the urinary tract.  In this procedure, a thin, flexible tube with a light at the end (ureteroscope) is used to look at the inside of the kidneys and the ureters.  You may need this procedure if you have frequent urinary tract infections (UTIs), blood in your urine, or a stone in a ureter. This information is not intended to replace advice given to you by your health care provider. Make sure you discuss any questions you have with your health care provider. Document Revised: 03/20/2019 Document Reviewed: 03/20/2019 Elsevier Patient Education  [employer].

## 2020-11-04 LAB — URINALYSIS, COMPLETE
Bilirubin, UA: NEGATIVE
Glucose, UA: NEGATIVE
Ketones, UA: NEGATIVE
Nitrite, UA: NEGATIVE
Specific Gravity, UA: >1.03 — ABNORMAL HIGH (ref 1.005–1.030)
Urobilinogen, Ur: 0.2 mg/dL (ref 0.2–1.0)
pH, UA: 5.5 (ref 5.0–7.5)

## 2020-11-04 LAB — MICROSCOPIC EXAMINATION
RBC, Urine: >30 /hpf — AB (ref 0–2)
WBC, UA: >30 /hpf — AB (ref 0–5)

## 2020-11-06 ENCOUNTER — Other Ambulatory Visit: Payer: Self-pay | Admitting: Urology

## 2020-11-06 DIAGNOSIS — N1330 Unspecified hydronephrosis: Secondary | ICD-10-CM

## 2020-11-09 LAB — CULTURE, URINE COMPREHENSIVE

## 2020-12-01 ENCOUNTER — Other Ambulatory Visit: Payer: Self-pay

## 2020-12-01 ENCOUNTER — Encounter
Admission: RE | Admit: 2020-12-01 | Discharge: 2020-12-01 | Disposition: A | Discharge status: Home / Self Care | Payer: Self-pay | Source: Ambulatory Visit | Attending: Urology | Admitting: Urology

## 2020-12-01 HISTORY — DX: Hypothyroidism, unspecified: E03.9

## 2020-12-01 HISTORY — DX: Personal history of urinary calculi: Z87.442

## 2020-12-01 HISTORY — DX: Gastro-esophageal reflux disease without esophagitis: K21.9

## 2020-12-01 HISTORY — DX: Anxiety disorder, unspecified: F41.9

## 2020-12-01 NOTE — Patient Instructions (Signed)
Your procedure is scheduled on: Monday 12/07/20 Report to [facility]. To find out your arrival time please call [phone] between 1PM - 3PM on Friday 12/04/20.  Remember: Instructions that are not followed completely may result in serious medical risk, up to and including death, or upon the discretion of your surgeon and anesthesiologist your surgery may need to be rescheduled.     _X__ 1. Do not eat food OR DRINK ANY LIQUIDS after midnight the night before your procedure.                 No gum chewing or hard candies.   __X__2.  On the morning of surgery brush your teeth with toothpaste and water, you                 may rinse your mouth with mouthwash if you wish.  Do not swallow any              toothpaste of mouthwash.     _X__ 3.  No Alcohol for 24 hours before or after surgery.   _X__ 4.  Do Not Smoke or use e-cigarettes For 24 Hours Prior to Your Surgery.                 Do not use any chewable tobacco products for at least 6 hours prior to                 surgery.  ____  5.  Bring all medications with you on the day of surgery if instructed.   __X__  6.  Notify your doctor if there is any change in your medical condition      (cold, fever, infections).     Do not wear jewelry, make-up, hairpins, clips or nail polish. Do not wear lotions, powders, or perfumes.  Do not shave 48 hours prior to surgery. Men may shave face and neck. Do not bring valuables to the hospital.    [facility] is not responsible for any belongings or valuables.  Contacts, dentures/partials or body piercings may not be worn into surgery. Bring a case for your contacts, glasses or hearing aids, a denture cup will be supplied. Leave your suitcase in the car. After surgery it may be brought to your room. For patients admitted to the hospital, discharge time is determined by your treatment team.   Patients discharged the day of surgery will not be  allowed to drive home.   Please read over the following fact sheets that you were given:     __X__ Take these medicines the morning of surgery with A SIP OF WATER:    1. cetirizine (ZYRTEC) 10 MG tablet  2. gabapentin (NEURONTIN) 300 MG capsule  3. levothyroxine (SYNTHROID) 50 MCG tablet  4. omeprazole (PRILOSEC) 40 MG capsule  5.  6.  ____ Fleet Enema (as directed)   __X__ Use CHG Soap/SAGE wipes as directed  ____ Use inhalers on the day of surgery  ____ Stop metformin/Janumet/Farxiga 2 days prior to surgery    ____ Take 1/2 of usual insulin dose the night before surgery. No insulin the morning          of surgery.   ____ Stop Blood Thinners Coumadin/Plavix/Xarelto/Pleta/Pradaxa/Eliquis/Effient/Aspirin  on   Or contact your Surgeon, Cardiologist or Medical Doctor regarding  ability to stop your blood thinners  __X__ Stop Anti-inflammatories 7 days before surgery such as Advil, Ibuprofen, Motrin,  BC or Goodies  Powder, Naprosyn, Naproxen, Aleve, Aspirin    __X__ Stop all herbal supplements, fish oil or vitamin E until after surgery.    ____ Bring C-Pap to the hospital.

## 2020-12-06 MED ORDER — LACTATED RINGERS IV SOLN: INTRAVENOUS | Status: DC

## 2020-12-06 MED ORDER — ORAL CARE MOUTH RINSE: 15.0000 mL | Freq: Once | OROMUCOSAL | Status: AC

## 2020-12-06 MED ORDER — CHLORHEXIDINE GLUCONATE 0.12 % MT SOLN
15.0000 mL | Freq: Once | OROMUCOSAL | Status: AC
Start: 2020-12-06 — End: 2020-12-07

## 2020-12-06 MED ORDER — CEFAZOLIN SODIUM-DEXTROSE 2-4 GM/100ML-% IV SOLN
2.0000 g | INTRAVENOUS | Status: AC
  Administered 2020-12-07: 2 g via INTRAVENOUS

## 2020-12-07 ENCOUNTER — Ambulatory Visit
Admission: RE | Admit: 2020-12-07 | Discharge: 2020-12-07 | Disposition: A | Discharge status: Home / Self Care | Payer: Self-pay | Attending: Urology | Admitting: Urology

## 2020-12-07 ENCOUNTER — Encounter
Admission: RE | Disposition: A | Discharge status: Home / Self Care | Payer: Self-pay | Source: Home / Self Care | Attending: Urology

## 2020-12-07 ENCOUNTER — Other Ambulatory Visit: Payer: Self-pay

## 2020-12-07 ENCOUNTER — Ambulatory Visit: Payer: Self-pay | Admitting: Anesthesiology

## 2020-12-07 ENCOUNTER — Encounter: Payer: Self-pay | Admitting: Urology

## 2020-12-07 ENCOUNTER — Ambulatory Visit: Payer: Self-pay

## 2020-12-07 DIAGNOSIS — N130 Hydronephrosis with ureteropelvic junction obstruction: Secondary | ICD-10-CM | POA: Insufficient documentation

## 2020-12-07 DIAGNOSIS — Z885 Allergy status to narcotic agent status: Secondary | ICD-10-CM | POA: Insufficient documentation

## 2020-12-07 DIAGNOSIS — Z880 Allergy status to penicillin: Secondary | ICD-10-CM | POA: Insufficient documentation

## 2020-12-07 DIAGNOSIS — N132 Hydronephrosis with renal and ureteral calculous obstruction: Secondary | ICD-10-CM | POA: Insufficient documentation

## 2020-12-07 DIAGNOSIS — Z87442 Personal history of urinary calculi: Secondary | ICD-10-CM | POA: Insufficient documentation

## 2020-12-07 DIAGNOSIS — N200 Calculus of kidney: Secondary | ICD-10-CM

## 2020-12-07 DIAGNOSIS — N1330 Unspecified hydronephrosis: Secondary | ICD-10-CM

## 2020-12-07 HISTORY — PX: CYSTOSCOPY WITH STENT PLACEMENT: SHX5790

## 2020-12-07 HISTORY — PX: CYSTOSCOPY W/ RETROGRADES: SHX1426

## 2020-12-07 HISTORY — PX: URETEROSCOPY: SHX842

## 2020-12-07 SURGERY — CYSTOSCOPY, WITH RETROGRADE PYELOGRAM
Anesthesia: General | Laterality: Right

## 2020-12-07 MED ORDER — FENTANYL CITRATE (PF) 100 MCG/2ML IJ SOLN
INTRAMUSCULAR | Status: AC
  Administered 2020-12-07: 25 ug via INTRAVENOUS
  Filled 2020-12-07: qty 2

## 2020-12-07 MED ORDER — ACETAMINOPHEN 10 MG/ML IV SOLN
INTRAVENOUS | Status: AC
  Filled 2020-12-07: qty 100

## 2020-12-07 MED ORDER — FENTANYL CITRATE (PF) 100 MCG/2ML IJ SOLN
INTRAMUSCULAR | Status: DC | PRN
  Administered 2020-12-07: 50 ug via INTRAVENOUS
  Administered 2020-12-07: 25 ug via INTRAVENOUS

## 2020-12-07 MED ORDER — LIDOCAINE HCL (CARDIAC) PF 100 MG/5ML IV SOSY
PREFILLED_SYRINGE | INTRAVENOUS | Status: DC | PRN
  Administered 2020-12-07: 80 mg via INTRAVENOUS

## 2020-12-07 MED ORDER — FENTANYL CITRATE (PF) 100 MCG/2ML IJ SOLN
INTRAMUSCULAR | Status: AC
  Filled 2020-12-07: qty 2

## 2020-12-07 MED ORDER — HYDROCODONE-ACETAMINOPHEN 5-325 MG PO TABS
ORAL_TABLET | ORAL | Status: AC
  Administered 2020-12-07: 1 via ORAL
  Filled 2020-12-07: qty 1

## 2020-12-07 MED ORDER — MIDAZOLAM HCL 2 MG/2ML IJ SOLN
INTRAMUSCULAR | Status: DC | PRN
  Administered 2020-12-07: 2 mg via INTRAVENOUS

## 2020-12-07 MED ORDER — PROPOFOL 10 MG/ML IV BOLUS
INTRAVENOUS | Status: DC | PRN
  Administered 2020-12-07: 100 mg via INTRAVENOUS
  Administered 2020-12-07: 50 mg via INTRAVENOUS

## 2020-12-07 MED ORDER — IOHEXOL 180 MG/ML  SOLN
INTRAMUSCULAR | Status: DC | PRN
  Administered 2020-12-07: 20 mL

## 2020-12-07 MED ORDER — EPHEDRINE SULFATE 50 MG/ML IJ SOLN
INTRAMUSCULAR | Status: DC | PRN
  Administered 2020-12-07 (×2): 5 mg via INTRAVENOUS
  Administered 2020-12-07: 10 mg via INTRAVENOUS

## 2020-12-07 MED ORDER — PROMETHAZINE HCL 25 MG/ML IJ SOLN: 6.2500 mg | INTRAMUSCULAR | Status: DC | PRN

## 2020-12-07 MED ORDER — DEXAMETHASONE SODIUM PHOSPHATE 10 MG/ML IJ SOLN
INTRAMUSCULAR | Status: DC | PRN
  Administered 2020-12-07: 5 mg via INTRAVENOUS

## 2020-12-07 MED ORDER — ACETAMINOPHEN 10 MG/ML IV SOLN
INTRAVENOUS | Status: DC | PRN
  Administered 2020-12-07: 1000 mg via INTRAVENOUS

## 2020-12-07 MED ORDER — CHLORHEXIDINE GLUCONATE 0.12 % MT SOLN
OROMUCOSAL | Status: AC
  Administered 2020-12-07: 15 mL via OROMUCOSAL
  Filled 2020-12-07: qty 15

## 2020-12-07 MED ORDER — OXYBUTYNIN CHLORIDE 5 MG PO TABS: 5.0000 mg | ORAL_TABLET | Freq: Three times a day (TID) | ORAL | 0 refills | Status: AC | PRN

## 2020-12-07 MED ORDER — PROMETHAZINE HCL 25 MG/ML IJ SOLN
INTRAMUSCULAR | Status: AC
  Administered 2020-12-07: 6.25 mg via INTRAVENOUS
  Filled 2020-12-07: qty 1

## 2020-12-07 MED ORDER — HYDROCODONE-ACETAMINOPHEN 5-325 MG PO TABS
1.0000 | ORAL_TABLET | Freq: Once | ORAL | Status: AC
Start: 2020-12-07 — End: 2020-12-07

## 2020-12-07 MED ORDER — MIDAZOLAM HCL 2 MG/2ML IJ SOLN
INTRAMUSCULAR | Status: AC
  Filled 2020-12-07: qty 2

## 2020-12-07 MED ORDER — ONDANSETRON HCL 4 MG/2ML IJ SOLN
INTRAMUSCULAR | Status: DC | PRN
  Administered 2020-12-07: 4 mg via INTRAVENOUS

## 2020-12-07 MED ORDER — HYDROCODONE-ACETAMINOPHEN 5-325 MG PO TABS: 1.0000 | ORAL_TABLET | Freq: Four times a day (QID) | ORAL | 0 refills | Status: DC | PRN

## 2020-12-07 MED ORDER — FENTANYL CITRATE (PF) 100 MCG/2ML IJ SOLN: 25.0000 ug | INTRAMUSCULAR | Status: DC | PRN

## 2020-12-07 MED ORDER — CEFAZOLIN SODIUM-DEXTROSE 2-4 GM/100ML-% IV SOLN
INTRAVENOUS | Status: AC
  Filled 2020-12-07: qty 100

## 2020-12-07 SURGICAL SUPPLY — 39 items
BAG DRAIN CYSTO-URO LG1000N (MISCELLANEOUS) ×5 IMPLANT
BRUSH SCRUB EZ  4% CHG (MISCELLANEOUS) ×2
BRUSH SCRUB EZ 1% IODOPHOR (MISCELLANEOUS) ×5 IMPLANT
BRUSH SCRUB EZ 4% CHG (MISCELLANEOUS) ×3 IMPLANT
CATH URET FLEX-TIP 2 LUMEN 10F (CATHETERS) ×5 IMPLANT
CATH URETL 5X70 OPEN END (CATHETERS) ×5 IMPLANT
CNTNR SPEC 2.5X3XGRAD LEK (MISCELLANEOUS) ×3
CONRAY 43 FOR UROLOGY 50M (MISCELLANEOUS) ×5 IMPLANT
CONT SPEC 4OZ STER OR WHT (MISCELLANEOUS) ×2
CONT SPEC 4OZ STRL OR WHT (MISCELLANEOUS) ×3
CONTAINER SPEC 2.5X3XGRAD LEK (MISCELLANEOUS) ×3 IMPLANT
COVER WAND RF STERILE (DRAPES) ×5 IMPLANT
DRAPE UTILITY 15X26 TOWEL STRL (DRAPES) ×5 IMPLANT
DRSG TELFA 4X3 1S NADH ST (GAUZE/BANDAGES/DRESSINGS) ×5 IMPLANT
ELECT REM PT RETURN 9FT ADLT (ELECTROSURGICAL) ×5
ELECTRODE REM PT RTRN 9FT ADLT (ELECTROSURGICAL) ×3 IMPLANT
GLIDEWIRE STIFF .35X180X3 HYDR (WIRE) ×5 IMPLANT
GLOVE SURG ENC MOIS LTX SZ6.5 (GLOVE) ×5 IMPLANT
GOWN STRL REUS W/ TWL LRG LVL3 (GOWN DISPOSABLE) ×6 IMPLANT
GOWN STRL REUS W/TWL LRG LVL3 (GOWN DISPOSABLE) ×10
GUIDEWIRE GREEN .038 145CM (MISCELLANEOUS) ×5 IMPLANT
GUIDEWIRE STR DUAL SENSOR (WIRE) ×10 IMPLANT
INFUSOR MANOMETER BAG 3000ML (MISCELLANEOUS) ×5 IMPLANT
IV NS IRRIG 3000ML ARTHROMATIC (IV SOLUTION) ×5 IMPLANT
KIT TURNOVER CYSTO (KITS) ×5 IMPLANT
MANIFOLD NEPTUNE II (INSTRUMENTS) ×5 IMPLANT
NDL SAFETY ECLIPSE 18X1.5 (NEEDLE) ×3 IMPLANT
NEEDLE HYPO 18GX1.5 SHARP (NEEDLE) ×5
PACK CYSTO AR (MISCELLANEOUS) ×5 IMPLANT
SET CYSTO W/LG BORE CLAMP LF (SET/KITS/TRAYS/PACK) ×5 IMPLANT
SHEATH URETERAL 12FRX35CM (MISCELLANEOUS) ×5 IMPLANT
STENT URET 6FRX24 CONTOUR (STENTS) ×5 IMPLANT
STENT URET 6FRX26 CONTOUR (STENTS) IMPLANT
SURGILUBE 2OZ TUBE FLIPTOP (MISCELLANEOUS) ×5 IMPLANT
SYR 30ML LL (SYRINGE) ×5 IMPLANT
SYR TOOMEY IRRIG 70ML (MISCELLANEOUS) ×5
SYRINGE TOOMEY IRRIG 70ML (MISCELLANEOUS) ×3 IMPLANT
WATER STERILE IRR 1000ML POUR (IV SOLUTION) ×5 IMPLANT
WATER STERILE IRR 3000ML UROMA (IV SOLUTION) ×5 IMPLANT

## 2020-12-07 NOTE — Anesthesia Procedure Notes (Signed)
Procedure Name: LMA Insertion Date/Time: 12/07/2020 10:14 AM Performed by: Lowry Bowl, CRNA Pre-anesthesia Checklist: Emergency Drugs available, Suction available, Patient being monitored and Patient identified Patient Re-evaluated:Patient Re-evaluated prior to induction Oxygen Delivery Method: Circle system utilized Preoxygenation: Pre-oxygenation with 100% oxygen Induction Type: IV induction Ventilation: Mask ventilation without difficulty LMA: LMA inserted LMA Size: 4.5 Number of attempts: 1 Placement Confirmation: positive ETCO2 and breath sounds checked- equal and bilateral Tube secured with: Tape Dental Injury: Teeth and Oropharynx as per pre-operative assessment

## 2020-12-07 NOTE — H&P (Signed)
H&P updated today 12/07/20 without change RRR CTAB  Preop UCx negative   Jennifer [name] 23-Nov-1975 [id number]   Referring provider: Donnie Coffin, MD [place],  [place] [address]      Chief Complaint  Patient presents with   Results      HPI: 45 year old female who presents today for further evaluation/discussion of CT urogram results.   Notably, over the past several months, she has been experiencing some nonspecific midline pain, nausea and suspicious appearing urinalyses.   CT urogram performed on 10/27/2018 shows new moderate right hydronephrosis and a possible stricture at the UPJ along with bilateral nonobstructing stones and chronic left renal scarring.  Notably, there is a questionable defect at this location.     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  This was seen on CT scan back in 10/2019 at that point in time, there was suspicion of urothelial thickening within the right renal pelvis as well.   Have some intermittent flank pain.  No gross hematuria.  No fevers or chills.   UA today appears persistently suspicious with greater than 30 RBCs and WBCs, moderate bacteria, nitrate negative but similar to previous urinalyses.  She denies any bladder symptoms.   Current and ongoing smoker.   PMH:     Past Medical History:  Diagnosis Date   Depression     Kidney stone     Uterine fibroid        Surgical History:      Past Surgical History:  Procedure Laterality Date   CYSTOSCOPY   11/01/2019    Procedure: CYSTOSCOPY;  Surgeon: Schermerhorn, Gwen Her, MD;  Location: [facility] ORS;  Service: Gynecology;;   EXTRACORPOREAL SHOCK WAVE LITHOTRIPSY Right 11/21/2019    Procedure: EXTRACORPOREAL SHOCK WAVE LITHOTRIPSY (ESWL);  Surgeon: Billey Co, MD;  Location: [facility] ORS;  Service: Urology;  Laterality: Right;   HYSTERECTOMY ABDOMINAL WITH SALPINGO-OOPHORECTOMY Bilateral 11/01/2019    Procedure: HYSTERECTOMY ABDOMINAL  WITH SALPINGECTOMY;  Surgeon: Schermerhorn, Gwen Her, MD;  Location: [facility] ORS;  Service: Gynecology;  Laterality: Bilateral;      Home Medications:       Allergies as of 11/03/2020       Reactions    Amoxicillin Swelling    Hydrocodone Other (See Comments)    headache           Medication List         Accurate as of Nov 03, 2020  2:50 PM. If you have any questions, ask your nurse or doctor.          STOP taking these medications   estradiol 1 MG tablet Commonly known as: ESTRACE Stopped by: Hollice Espy, MD    nicotine 21 mg/24hr patch Commonly known as: NICODERM CQ - dosed in mg/24 hours Stopped by: Hollice Espy, MD    tamsulosin 0.4 MG Caps capsule Commonly known as: FLOMAX Stopped by: Hollice Espy, MD       TAKE these medications   cetirizine 10 MG tablet Commonly known as: ZYRTEC Take 10 mg by mouth daily.    gabapentin 300 MG capsule Commonly known as: Neurontin Take 1 capsule (300 mg total) by mouth 2 (two) times daily.    ibuprofen 800 MG tablet Commonly known as: ADVIL Take 1 tablet (800 mg total) by mouth every 6 (six) hours.    omeprazole 40 MG capsule Commonly known as: PRILOSEC Take by mouth.  ondansetron 4 MG tablet Commonly known as: ZOFRAN Take 1 tablet (4 mg total) by mouth every 6 (six) hours as needed for nausea.    sertraline 50 MG tablet Commonly known as: ZOLOFT Take 50 mg by mouth daily.           Allergies:       Allergies  Allergen Reactions   Amoxicillin Swelling   Hydrocodone Other (See Comments)      headache      Family History: No family history on file.   Social History:  reports that she has been smoking cigarettes. She has been smoking about 1.00 pack per day. She has never used smokeless tobacco. She reports that she does not drink alcohol and does not use drugs.     Physical Exam: BP (!) 144/78   Pulse 73   Ht 5\' 4"  (1.626 m)   Wt 216 lb (98 kg)   LMP 08/22/2019   BMI 37.08 kg/m    Constitutional:  Alert and oriented, No acute distress. HEENT: [place] AT, moist mucus membranes.  Trachea midline, no masses. Cardiovascular: No clubbing, cyanosis, or edema. Respiratory: Normal respiratory effort, no increased work of breathing. Skin: No rashes, bruises or suspicious lesions. Neurologic: Grossly intact, no focal deficits, moving all 4 extremities. Psychiatric: Normal mood and affect.   Laboratory Data: Recent Labs       Lab Results  Component Value Date    WBC 5.6 06/13/2020    HGB 14.0 06/13/2020    HCT 42.5 06/13/2020    MCV 95.1 06/13/2020    PLT 279 06/13/2020        Recent Labs       Lab Results  Component Value Date    CREATININE 0.93 06/13/2020        Urinalysis      Results for orders placed or performed in visit on 11/03/20  CULTURE, URINE COMPREHENSIVE    Specimen: Urine    UR  Result Value Ref Range    Urine Culture, Comprehensive Final report      Organism ID, Bacteria Comment    Microscopic Examination    Urine  Result Value Ref Range    WBC, UA >30 (A) 0 - 5 /hpf    RBC >30 (A) 0 - 2 /hpf    Epithelial Cells (non renal) 0-10 0 - 10 /hpf    Bacteria, UA Moderate (A) None seen/Few  Urinalysis, Complete  Result Value Ref Range    Specific Gravity, UA >1.030 (H) 1.005 - 1.030    pH, UA 5.5 5.0 - 7.5    Color, UA Yellow Yellow    Appearance Ur Cloudy (A) Clear    Leukocytes,UA 1+ (A) Negative    Protein,UA 1+ (A) Negative/Trace    Glucose, UA Negative Negative    Ketones, UA Negative Negative    RBC, UA 3+ (A) Negative    Bilirubin, UA Negative Negative    Urobilinogen, Ur 0.2 0.2 - 1.0 mg/dL    Nitrite, UA Negative Negative    Microscopic Examination See below:        Pertinent Imaging: Results for orders placed during the hospital encounter of 10/26/20   CT HEMATURIA WORKUP   Narrative CLINICAL DATA:  Recurrent left flank pain for 6 months. Microscopic hematuria.   EXAM: CT ABDOMEN AND PELVIS WITHOUT AND WITH  CONTRAST   TECHNIQUE: Multidetector CT imaging of the abdomen and pelvis was performed following the standard protocol before and following the bolus administration of intravenous  contrast.   CONTRAST:  167mL OMNIPAQUE IOHEXOL 300 MG/ML  SOLN   COMPARISON:  Noncontrast CT on 10/30/2019   FINDINGS: Lower Chest: No acute findings.   Hepatobiliary: Moderate diffuse hepatic steatosis again noted. No hepatic masses identified. Gallbladder is unremarkable. No evidence of biliary ductal dilatation.   Pancreas:  No mass or inflammatory changes.   Spleen: Within normal limits in size and appearance.   Adrenals/Urinary Tract: No adrenal masses identified. Multiple tiny renal calculi are seen bilaterally, largest in lower pole of left kidney measuring 4 mm. No evidence of left-sided hydronephrosis.   Mild to moderate right hydronephrosis is seen which is new since previous study, with a stricture seen at the right UPJ. No definite mass is visualized at this site, however a small urothelial carcinoma cannot definitely be excluded. No evidence of ureteral calculi. No renal parenchymal masses identified. Left renal parenchymal scarring again noted. Unremarkable unopacified urinary bladder.   Stomach/Bowel: No evidence of obstruction, inflammatory process or abnormal fluid collections. Normal appendix visualized.   Vascular/Lymphatic: No pathologically enlarged lymph nodes. No acute vascular findings.   Reproductive: Prior hysterectomy noted. Adnexal regions are unremarkable in appearance.   Other:  None.   Musculoskeletal:  No suspicious bone lesions identified.   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 No definite mass is visualized at this site, however a small urothelial carcinoma cannot definitely be excluded. Consider retrograde ureteroscopy for further evaluation.   Bilateral nonobstructing renal calculi, and chronic left  renal parenchymal scarring.   Stable moderate hepatic steatosis.     Electronically Signed By: Marlaine Hind M.D. On: 10/26/2020 17:12   CT urogram results reviewed.  I also personally reviewed the imaging.  Agree with radiologic interpretation.  There is a good chance that this hydronephrosis is chronic or may be associated with stricture versus UPJ obstruction.  Urothelial carcinoma also remains in the differential diagnosis.   Assessment & Plan:     1. Microscopic hematuria Plan for cysto in OR for work up/ treatment of #2 and #3 - Urinalysis, Complete - CULTURE, URINE COMPREHENSIVE   2. Kidney stones We will can consider treating right-sided stones at the time of #3   3. Hydronephrosis of right kidney Right hydronephrosis with possible stricture versus UPJ obstruction versus urothelial lesion at right renal pelvis   Previous images were reviewed is that she had some mild hydronephrosis chronically thus this may somewhat chronic issue related to stone disease   I recommended proceeding to the operating room for cystoscopy, right retrograde pyelogram, right diagnostic ureteroscopy with possible biopsy, possible balloon dilation, possible laser lithotripsy and ureteral stent placement for both diagnostic and possibly therapeutic purposes.  She is agreeable this plan.  We discussed the risk and benefits including risk of bleeding, infection, damage surrounding structures, stent discomfort, need for further procedures amongst others.  She is agreeable.   Preoperative UA/urine culture.     Hollice Espy, MD   [facility] [address] [place], [place] [address] [phone]

## 2020-12-07 NOTE — Anesthesia Preprocedure Evaluation (Signed)
Anesthesia Evaluation  Patient identified by MRN, date of birth, ID band Patient awake    Reviewed: Allergy & Precautions, H&P , NPO status , Patient's Chart, lab work & pertinent test results, reviewed documented beta blocker date and time   History of Anesthesia Complications Negative for: history of anesthetic complications  Airway Mallampati: III  TM Distance: >3 FB Neck ROM: full    Dental  (+) Dental Advidsory Given, Teeth Intact   Pulmonary neg shortness of breath, neg COPD, neg recent URI, Current Smoker and Patient abstained from smoking.,    Pulmonary exam normal breath sounds clear to auscultation       Cardiovascular Exercise Tolerance: Good negative cardio ROS Normal cardiovascular exam Rhythm:regular Rate:Normal     Neuro/Psych negative neurological ROS  negative psych ROS   GI/Hepatic Neg liver ROS, GERD  ,  Endo/Other  neg diabetesMorbid obesity  Renal/GU Renal disease (kidney stones)  negative genitourinary   Musculoskeletal   Abdominal   Peds  Hematology negative hematology ROS (+)   Anesthesia Other Findings Past Medical History: No date: Depression No date: Uterine fibroid   Reproductive/Obstetrics negative OB ROS                             Anesthesia Physical  Anesthesia Plan  ASA: 3  Anesthesia Plan: General   Post-op Pain Management:    Induction: Intravenous  PONV Risk Score and Plan: 3 and Ondansetron, Dexamethasone, Midazolam, Promethazine and Treatment may vary due to age or medical condition  Airway Management Planned: LMA  Additional Equipment:   Intra-op Plan:   Post-operative Plan: Extubation in OR  Informed Consent: I have reviewed the patients History and Physical, chart, labs and discussed the procedure including the risks, benefits and alternatives for the proposed anesthesia with the patient or authorized representative who has  indicated his/her understanding and acceptance.     Dental Advisory Given  Plan Discussed with: Anesthesiologist, CRNA and Surgeon  Anesthesia Plan Comments:         Anesthesia Quick Evaluation

## 2020-12-07 NOTE — Transfer of Care (Signed)
Immediate Anesthesia Transfer of Care Note  Patient: Jennifer [name]  Procedure(s) Performed: CYSTOSCOPY WITH RETROGRADE PYELOGRAM (Bilateral) URETEROSCOPY (Right) CYSTOSCOPY WITH STENT PLACEMENT (Right)  Patient Location: PACU  Anesthesia Type:General  Level of Consciousness: awake, drowsy and patient cooperative  Airway & Oxygen Therapy: Patient Spontanous Breathing  Post-op Assessment: Report given to RN and Post -op Vital signs reviewed and stable  Post vital signs: Reviewed and stable  Last Vitals:  Vitals Value Taken Time  BP 161/95 12/07/20 1052  Temp 36.3 C 12/07/20 1052  Pulse 75 12/07/20 1056  Resp 19 12/07/20 1056  SpO2 97 % 12/07/20 1056  Vitals shown include unvalidated device data.  Last Pain:  Vitals:   12/07/20 0837  TempSrc: Temporal  PainSc: 0-No pain         Complications: No notable events documented.

## 2020-12-07 NOTE — Anesthesia Postprocedure Evaluation (Signed)
Anesthesia Post Note  Patient: Production assistant, radio  Procedure(s) Performed: CYSTOSCOPY WITH RETROGRADE PYELOGRAM (Bilateral) URETEROSCOPY (Right) CYSTOSCOPY WITH STENT PLACEMENT (Right)  Patient location during evaluation: PACU Anesthesia Type: General Level of consciousness: awake and alert Pain management: pain level controlled Vital Signs Assessment: post-procedure vital signs reviewed and stable Respiratory status: spontaneous breathing, nonlabored ventilation, respiratory function stable and patient connected to nasal cannula oxygen Cardiovascular status: blood pressure returned to baseline and stable Postop Assessment: no apparent nausea or vomiting Anesthetic complications: no   No notable events documented.   Last Vitals:  Vitals:   12/07/20 1130 12/07/20 1138  BP: 136/78 (!) 143/71  Pulse: 68 64  Resp: 17 18  Temp: (!) 36.2 C 36.6 C  SpO2: 94% 95%    Last Pain:  Vitals:   12/07/20 1138  TempSrc: Oral  PainSc: 4                  Martha Clan

## 2020-12-07 NOTE — Discharge Instructions (Addendum)
You have a ureteral stent in place.  This is a tube that extends from your kidney to your bladder.  This may cause urinary bleeding, burning with urination, and urinary frequency.  Please call our office or present to the ED if you develop fevers >101 or pain which is not able to be controlled with oral pain medications.  You may be given either Flomax and/ or ditropan to help with bladder spasms and stent pain in addition to pain medications.    [facility] [address], [address] [place], [place] [address] [phone]  AMBULATORY SURGERY  DISCHARGE INSTRUCTIONS   The drugs that you were given will stay in your system until tomorrow so for the next 24 hours you should not:  Drive an automobile Make any legal decisions Drink any alcoholic beverage   You may resume regular meals tomorrow.  Today it is better to start with liquids and gradually work up to solid foods.  You may eat anything you prefer, but it is better to start with liquids, then soup and crackers, and gradually work up to solid foods.   Please notify your doctor immediately if you have any unusual bleeding, trouble breathing, redness and pain at the surgery site, drainage, fever, or pain not relieved by medication.    Additional Instructions:   Please contact your physician with any problems or Same Day Surgery at [phone], Monday through Friday 6 am to 4 pm, or [place] [facility] [place] Main number at [phone].  

## 2020-12-07 NOTE — Op Note (Signed)
Date of procedure: 12/07/20  Preoperative diagnosis:  Right hydronephrosis Right flank pain Kidney stones  Postoperative diagnosis:  Same as above Right UPJ stricture of unknown origin  Procedure: Cystoscopy Bilateral retrograde pyelogram Right diagnostic ureteroscopy Right ureteral stent placement  Surgeon: Hollice Espy, MD  Anesthesia: General  Complications: None  Intraoperative findings: Right hydronephrosis with what initially was felt to be a UPJ obstruction with hydronephrosis to the level of the UPJ and a slightly drooping appearance of the kidney.  Some difficulty advancing the wire through the strictured area, required ureteroscopy with direct visualization in order to guide the wire into the renal pelvis.  Unable to advance the scope through the strictured area, elected to place a stent for passive dilation and return for staged procedure.  EBL: Minimal  Specimens: None  Drains: 6 x 24 French double-J ureteral stent on right  Indication: Jennifer [name] is a 45 y.o. patient with ongoing right flank pain found to have right hydronephrosis concerning for right UPJ obstruction versus stricture.  After reviewing the management options for treatment, she elected to proceed with the above surgical procedure(s). We have discussed the potential benefits and risks of the procedure, side effects of the proposed treatment, the likelihood of the patient achieving the goals of the procedure, and any potential problems that might occur during the procedure or recuperation. Informed consent has been obtained.  Description of procedure:  The patient was taken to the operating room and general anesthesia was induced.  The patient was placed in the dorsal lithotomy position, prepped and draped in the usual sterile fashion, and preoperative antibiotics were administered. A preoperative time-out was performed.   A 21 French the scope was advanced per urethra to the bladder.  The bladder  was inspected and noted to be completely normal.  Attention was first turned to the left ureteral orifice which is inserted just within the UO.  A gentle retrograde pyelogram on this side revealed no hydroureteronephrosis or filling defects.  Attention was then turned to the right side.  This was intubated using the same 5 [place] open-ended ureteral catheter and retrograde on this side revealed a decompressed ureter with a abnormal appearing UPJ and moderate hydronephrosis to that level with a slight droop appearance of the kidney which initially felt to be most consistent with possible UPJ obstruction.  I then advanced a Super Stiff wire up to the level what I thought was the renal pelvis and ended up advancing a 7 [place] single digital flexible ureteroscope over the wire up to the proximal ureter at which time I noted that the wire was not in fact within the renal pelvis rather was curled just distal to this.  I then attempted under direct visualization to visualize the opening into the renal pelvis unsuccessfully.  He shot additional contrast which showed no contrast extravasation.  I then used a sensor wire unsuccessfully followed by an angled Glidewire and ultimately was able to identify what he felt was the true lumen into the right collecting system.  Was unable to advance the scope over this.  As such, I backed the scope out and advanced a 5 [place] open-ended ureteral catheter over the Glidewire into what I thought was a lower pole calyx and injected contrast.  This proved in fact that the wire was previously curled into a lower pole calyx and I had successfully entered the collecting system without perforation or extravasation appreciated.  Due to the relative tightness of the UPJ obstruction unknown etiology of  the stricture, congenital, malignant versus traumatic, I went ahead and elected to place a stent and will return after some passive dilation for direct visualization and possible biopsies.  I  advanced a 6 x 24 French double-J ureteral stent over the wire up to the level of the renal pelvis.  Once withdrawn, full coils noted both within the renal pelvis as well as within the bladder.  This clearly bypassed the strictured area.  It was relatively tight upon placing the 6 French stent.  The bladder was drained.  The patient was cleaned and dried, repositioned in supine position, reversed myesthesia, and taken to the PACU in stable condition.  Plan: We will have her return to clinic later this week to discuss options including balloon dilation of the UPJ versus more definitive management.  Prior to this though, I do think second look ureteroscopy to rule out malignant obstruction is warranted.  Hollice Espy, M.D.

## 2020-12-07 NOTE — H&P (View-Only) (Signed)
H&P updated today 12/07/20 without change RRR CTAB  Preop UCx negative   Jennifer [name] June 17, 1976 [id number]   Referring provider: Donnie Coffin, MD [place],  [place] [address]      Chief Complaint  Patient presents with   Results      HPI: 45 year old female who presents today for further evaluation/discussion of CT urogram results.   Notably, over the past several months, she has been experiencing some nonspecific midline pain, nausea and suspicious appearing urinalyses.   CT urogram performed on 10/27/2018 shows new moderate right hydronephrosis and a possible stricture at the UPJ along with bilateral nonobstructing stones and chronic left renal scarring.  Notably, there is a questionable defect at this location.     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  This was seen on CT scan back in 10/2019 at that point in time, there was suspicion of urothelial thickening within the right renal pelvis as well.   Have some intermittent flank pain.  No gross hematuria.  No fevers or chills.   UA today appears persistently suspicious with greater than 30 RBCs and WBCs, moderate bacteria, nitrate negative but similar to previous urinalyses.  She denies any bladder symptoms.   Current and ongoing smoker.   PMH:     Past Medical History:  Diagnosis Date   Depression     Kidney stone     Uterine fibroid        Surgical History:      Past Surgical History:  Procedure Laterality Date   CYSTOSCOPY   11/01/2019    Procedure: CYSTOSCOPY;  Surgeon: Schermerhorn, Gwen Her, MD;  Location: [facility];  Service: Gynecology;;   EXTRACORPOREAL SHOCK WAVE LITHOTRIPSY Right 11/21/2019    Procedure: EXTRACORPOREAL SHOCK WAVE LITHOTRIPSY (ESWL);  Surgeon: Billey Co, MD;  Location: [facility];  Service: Urology;  Laterality: Right;   HYSTERECTOMY ABDOMINAL WITH SALPINGO-OOPHORECTOMY Bilateral 11/01/2019    Procedure: HYSTERECTOMY ABDOMINAL  WITH SALPINGECTOMY;  Surgeon: Schermerhorn, Gwen Her, MD;  Location: [facility];  Service: Gynecology;  Laterality: Bilateral;      Home Medications:       Allergies as of 11/03/2020       Reactions    Amoxicillin Swelling    Hydrocodone Other (See Comments)    headache           Medication List         Accurate as of Nov 03, 2020  2:50 PM. If you have any questions, ask your nurse or doctor.          STOP taking these medications   estradiol 1 MG tablet Commonly known as: ESTRACE Stopped by: Hollice Espy, MD    nicotine 21 mg/24hr patch Commonly known as: NICODERM CQ - dosed in mg/24 hours Stopped by: Hollice Espy, MD    tamsulosin 0.4 MG Caps capsule Commonly known as: FLOMAX Stopped by: Hollice Espy, MD       TAKE these medications   cetirizine 10 MG tablet Commonly known as: ZYRTEC Take 10 mg by mouth daily.    gabapentin 300 MG capsule Commonly known as: Neurontin Take 1 capsule (300 mg total) by mouth 2 (two) times daily.    ibuprofen 800 MG tablet Commonly known as: ADVIL Take 1 tablet (800 mg total) by mouth every 6 (six) hours.    omeprazole 40 MG capsule Commonly known as: PRILOSEC Take by mouth.  ondansetron 4 MG tablet Commonly known as: ZOFRAN Take 1 tablet (4 mg total) by mouth every 6 (six) hours as needed for nausea.    sertraline 50 MG tablet Commonly known as: ZOLOFT Take 50 mg by mouth daily.           Allergies:       Allergies  Allergen Reactions   Amoxicillin Swelling   Hydrocodone Other (See Comments)      headache      Family History: No family history on file.   Social History:  reports that she has been smoking cigarettes. She has been smoking about 1.00 pack per day. She has never used smokeless tobacco. She reports that she does not drink alcohol and does not use drugs.     Physical Exam: BP (!) 144/78   Pulse 73   Ht 5\' 4"  (1.626 m)   Wt 216 lb (98 kg)   LMP 08/22/2019   BMI 37.08 kg/m    Constitutional:  Alert and oriented, No acute distress. HEENT: [place] AT, moist mucus membranes.  Trachea midline, no masses. Cardiovascular: No clubbing, cyanosis, or edema. Respiratory: Normal respiratory effort, no increased work of breathing. Skin: No rashes, bruises or suspicious lesions. Neurologic: Grossly intact, no focal deficits, moving all 4 extremities. Psychiatric: Normal mood and affect.   Laboratory Data: Recent Labs       Lab Results  Component Value Date    WBC 5.6 06/13/2020    HGB 14.0 06/13/2020    HCT 42.5 06/13/2020    MCV 95.1 06/13/2020    PLT 279 06/13/2020        Recent Labs       Lab Results  Component Value Date    CREATININE 0.93 06/13/2020        Urinalysis      Results for orders placed or performed in visit on 11/03/20  CULTURE, URINE COMPREHENSIVE    Specimen: Urine    UR  Result Value Ref Range    Urine Culture, Comprehensive Final report      Organism ID, Bacteria Comment    Microscopic Examination    Urine  Result Value Ref Range    WBC, UA >30 (A) 0 - 5 /hpf    RBC >30 (A) 0 - 2 /hpf    Epithelial Cells (non renal) 0-10 0 - 10 /hpf    Bacteria, UA Moderate (A) None seen/Few  Urinalysis, Complete  Result Value Ref Range    Specific Gravity, UA >1.030 (H) 1.005 - 1.030    pH, UA 5.5 5.0 - 7.5    Color, UA Yellow Yellow    Appearance Ur Cloudy (A) Clear    Leukocytes,UA 1+ (A) Negative    Protein,UA 1+ (A) Negative/Trace    Glucose, UA Negative Negative    Ketones, UA Negative Negative    RBC, UA 3+ (A) Negative    Bilirubin, UA Negative Negative    Urobilinogen, Ur 0.2 0.2 - 1.0 mg/dL    Nitrite, UA Negative Negative    Microscopic Examination See below:        Pertinent Imaging: Results for orders placed during the hospital encounter of 10/26/20   CT HEMATURIA WORKUP   Narrative CLINICAL DATA:  Recurrent left flank pain for 6 months. Microscopic hematuria.   EXAM: CT ABDOMEN AND PELVIS WITHOUT AND WITH  CONTRAST   TECHNIQUE: Multidetector CT imaging of the abdomen and pelvis was performed following the standard protocol before and following the bolus administration of intravenous  contrast.   CONTRAST:  148mL OMNIPAQUE IOHEXOL 300 MG/ML  SOLN   COMPARISON:  Noncontrast CT on 10/30/2019   FINDINGS: Lower Chest: No acute findings.   Hepatobiliary: Moderate diffuse hepatic steatosis again noted. No hepatic masses identified. Gallbladder is unremarkable. No evidence of biliary ductal dilatation.   Pancreas:  No mass or inflammatory changes.   Spleen: Within normal limits in size and appearance.   Adrenals/Urinary Tract: No adrenal masses identified. Multiple tiny renal calculi are seen bilaterally, largest in lower pole of left kidney measuring 4 mm. No evidence of left-sided hydronephrosis.   Mild to moderate right hydronephrosis is seen which is new since previous study, with a stricture seen at the right UPJ. No definite mass is visualized at this site, however a small urothelial carcinoma cannot definitely be excluded. No evidence of ureteral calculi. No renal parenchymal masses identified. Left renal parenchymal scarring again noted. Unremarkable unopacified urinary bladder.   Stomach/Bowel: No evidence of obstruction, inflammatory process or abnormal fluid collections. Normal appendix visualized.   Vascular/Lymphatic: No pathologically enlarged lymph nodes. No acute vascular findings.   Reproductive: Prior hysterectomy noted. Adnexal regions are unremarkable in appearance.   Other:  None.   Musculoskeletal:  No suspicious bone lesions identified.   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 No definite mass is visualized at this site, however a small urothelial carcinoma cannot definitely be excluded. Consider retrograde ureteroscopy for further evaluation.   Bilateral nonobstructing renal calculi, and chronic left  renal parenchymal scarring.   Stable moderate hepatic steatosis.     Electronically Signed By: Marlaine Hind M.D. On: 10/26/2020 17:12   CT urogram results reviewed.  I also personally reviewed the imaging.  Agree with radiologic interpretation.  There is a good chance that this hydronephrosis is chronic or may be associated with stricture versus UPJ obstruction.  Urothelial carcinoma also remains in the differential diagnosis.   Assessment & Plan:     1. Microscopic hematuria Plan for cysto in OR for work up/ treatment of #2 and #3 - Urinalysis, Complete - CULTURE, URINE COMPREHENSIVE   2. Kidney stones We will can consider treating right-sided stones at the time of #3   3. Hydronephrosis of right kidney Right hydronephrosis with possible stricture versus UPJ obstruction versus urothelial lesion at right renal pelvis   Previous images were reviewed is that she had some mild hydronephrosis chronically thus this may somewhat chronic issue related to stone disease   I recommended proceeding to the operating room for cystoscopy, right retrograde pyelogram, right diagnostic ureteroscopy with possible biopsy, possible balloon dilation, possible laser lithotripsy and ureteral stent placement for both diagnostic and possibly therapeutic purposes.  She is agreeable this plan.  We discussed the risk and benefits including risk of bleeding, infection, damage surrounding structures, stent discomfort, need for further procedures amongst others.  She is agreeable.   Preoperative UA/urine culture.     Hollice Espy, MD   [facility] [address] [place], [place] [address] [phone]

## 2020-12-08 ENCOUNTER — Encounter: Payer: Self-pay | Admitting: Urology

## 2020-12-09 ENCOUNTER — Other Ambulatory Visit: Payer: Self-pay

## 2020-12-09 ENCOUNTER — Ambulatory Visit (INDEPENDENT_AMBULATORY_CARE_PROVIDER_SITE_OTHER): Payer: Self-pay | Admitting: Urology

## 2020-12-09 VITALS — BP 133/74 | HR 4 | Wt 216.0 lb

## 2020-12-09 DIAGNOSIS — N135 Crossing vessel and stricture of ureter without hydronephrosis: Secondary | ICD-10-CM

## 2020-12-09 DIAGNOSIS — R109 Unspecified abdominal pain: Secondary | ICD-10-CM

## 2020-12-09 MED ORDER — CEPHALEXIN 500 MG PO CAPS: 500.0000 mg | ORAL_CAPSULE | Freq: Three times a day (TID) | ORAL | 0 refills | Status: AC

## 2020-12-09 MED ORDER — MIRABEGRON ER 50 MG PO TB24: 50.0000 mg | ORAL_TABLET | Freq: Every day | ORAL | 0 refills | Status: AC

## 2020-12-09 MED ORDER — KETOROLAC TROMETHAMINE 60 MG/2ML IM SOLN
60.0000 mg | Freq: Once | INTRAMUSCULAR | Status: AC
  Administered 2020-12-09: 60 mg via INTRAMUSCULAR

## 2020-12-09 NOTE — Progress Notes (Signed)
12/09/2020 1:19 PM   Jennifer [name] 05-Oct-1975 [id number]  Referring provider: Donnie Coffin, MD [place],  [place] [address]  Chief Complaint  Patient presents with   Follow-up    HPI: 45 year old female who returns today for follow-up visit to discuss management of left UPJ/proximal ureteral stricture.  She initially presented with worsening right flank pain.  She is found to have hydroureteronephrosis to the level of the right proximal ureter/UPJ at the site of a previous stone which was treated last year by ESWL.  She was taken to the operating room on Monday for retrograde pyelogram which showed stenosis of this area.  With some difficulty, ultimately wire was able to be placed under direct visualization across the stenotic area but endoscopic direct visualization was not possible nor was biopsy to rule out underlying etiology.  A stent was able to be placed.  She returns today complaining of severe stent pain.  Initially when I entered the room, she was in so much pain, she was writhing and moaning and not able to participate in conversation.  She has been taking narcotics along with ibuprofen and oxybutynin as prescribed which did not help with her stent pain.  She was administered 60 mg of IV Toradol here in the office as well as 1 dose of p.o. Myrbetriq 50 mg as part of a sample pack.  After about 45 minutes, her pain is almost completely resolved.  She reports at home, she has been having dysuria, urgency, frequency, and flank pain related to the stent.  No fevers or chills.  No nausea or vomiting.   PMH: Past Medical History:  Diagnosis Date   Anxiety    Depression    GERD (gastroesophageal reflux disease)    History of kidney stones    Hypothyroidism    Kidney stone    Uterine fibroid     Surgical History: Past Surgical History:  Procedure Laterality Date   CYSTOSCOPY  11/01/2019   Procedure: CYSTOSCOPY;  Surgeon: Schermerhorn, Gwen Her, MD;   Location: [facility];  Service: Gynecology;;   Consuela Mimes W/ RETROGRADES Bilateral 12/07/2020   Procedure: CYSTOSCOPY WITH RETROGRADE PYELOGRAM;  Surgeon: Hollice Espy, MD;  Location: [facility];  Service: Urology;  Laterality: Bilateral;   CYSTOSCOPY WITH STENT PLACEMENT Right 12/07/2020   Procedure: CYSTOSCOPY WITH STENT PLACEMENT;  Surgeon: Hollice Espy, MD;  Location: [facility];  Service: Urology;  Laterality: Right;   EXTRACORPOREAL SHOCK WAVE LITHOTRIPSY Right 11/21/2019   Procedure: EXTRACORPOREAL SHOCK WAVE LITHOTRIPSY (ESWL);  Surgeon: Billey Co, MD;  Location: [facility];  Service: Urology;  Laterality: Right;   HYSTERECTOMY ABDOMINAL WITH SALPINGO-OOPHORECTOMY Bilateral 11/01/2019   Procedure: HYSTERECTOMY ABDOMINAL WITH SALPINGECTOMY;  Surgeon: Schermerhorn, Gwen Her, MD;  Location: [facility];  Service: Gynecology;  Laterality: Bilateral;   URETEROSCOPY Right 12/07/2020   Procedure: URETEROSCOPY;  Surgeon: Hollice Espy, MD;  Location: [facility];  Service: Urology;  Laterality: Right;    Home Medications:  Allergies as of 12/09/2020       Reactions   Amoxicillin Swelling   Hydrocodone Other (See Comments)   headache        Medication List        Accurate as of December 09, 2020  1:19 PM. If you have any questions, ask your nurse or doctor.          atorvastatin 10 MG tablet Commonly known as: LIPITOR Take 10 mg by mouth at bedtime.   cephALEXin 500 MG capsule  Commonly known as: Keflex Take 1 capsule (500 mg total) by mouth 3 (three) times daily for 7 days. Started by: Hollice Espy, MD   cetirizine 10 MG tablet Commonly known as: ZYRTEC Take 10 mg by mouth daily.   gabapentin 300 MG capsule Commonly known as: NEURONTIN Take 300 mg by mouth 2 (two) times daily.   HYDROcodone-acetaminophen 5-325 MG tablet Commonly known as: NORCO/VICODIN Take 1-2 tablets by mouth every 6 (six) hours as needed for moderate pain.   levothyroxine 50 MCG tablet Commonly  known as: SYNTHROID Take 50 mcg by mouth daily before breakfast.   mirabegron ER 50 MG Tb24 tablet Commonly known as: MYRBETRIQ Take 1 tablet (50 mg total) by mouth daily. Started by: Hollice Espy, MD   multivitamin with minerals tablet Take 1 tablet by mouth daily.   omeprazole 40 MG capsule Commonly known as: PRILOSEC Take 40 mg by mouth daily.   oxybutynin 5 MG tablet Commonly known as: DITROPAN Take 1 tablet (5 mg total) by mouth every 8 (eight) hours as needed for bladder spasms.   ProAir HFA 108 (90 Base) MCG/ACT inhaler Generic drug: albuterol Inhale 2 puffs into the lungs every 4 (four) hours as needed.   PROBIOTIC DAILY PO Take 1 capsule by mouth daily.   sertraline 100 MG tablet Commonly known as: ZOLOFT Take 100 mg by mouth at bedtime.        Allergies:  Allergies  Allergen Reactions   Amoxicillin Swelling   Hydrocodone Other (See Comments)    headache    Family History: No family history on file.  Social History:  reports that she has been smoking cigarettes. She has been smoking an average of 1.00 packs per day. She has never used smokeless tobacco. She reports that she does not drink alcohol and does not use drugs.   Physical Exam: BP 133/74   Pulse (!) 4   Wt 216 lb (98 kg)   LMP 08/22/2019   BMI 37.08 kg/m   Constitutional: Severely uncomfortable, resolved with Toradol and Myrbetriq HEENT: [place] AT, moist mucus membranes.  Trachea midline, no masses. Cardiovascular: No clubbing, cyanosis, or edema. Respiratory: Normal respiratory effort, no increased work of breathing. Skin: No rashes, bruises or suspicious lesions. Neurologic: Grossly intact, no focal deficits, moving all 4 extremities. Psychiatric: Normal mood and affect.    Assessment & Plan:    1. Ureteral stricture, right Discussed intraoperative findings, differential diagnosis still includes intraluminal obstruction including mass, debris but more likely as a result of  fibrosis/scarring from her previous stone  I have recommended returning to the operating room for second look, possible biopsy and also consideration of balloon dilation.  We discussed the probability of balloon dilation being durably effective is only about 50% and she need to maintain her stent for a few weeks postop.  We also briefly discussed today that ultimately, she may need a pyeloplasty with excision of this scar.  We briefly talked about this today and will discuss it further if she fails balloon dilation. - ketorolac (TORADOL) injection 60 mg  2. Right flank pain Severe poorly controlled flank pain related to the stent  She was treated here in the office today with Toradol and Myrbetriq and her symptoms resolved fairly quickly  We will send her out on Keflex for the next week for presumed possible infection as she was unable to void today and will be having an upcoming second look procedure with the utmost precaution.  She is also given 2 weeks  of Myrbetriq 50 mg to take to help with her bladder spasm in addition to the narcotics which she already has as well as oxybutynin as needed.  She is agreeable to the aforementioned plan and understands all the risk including bleeding, infection, damage surrounding structures, stent pain, need for prolonged stent or additional procedures.  All questions were answered.    Hollice Espy, MD  [facility] [address] [place], [place] [address] [phone]

## 2020-12-11 ENCOUNTER — Telehealth: Payer: Self-pay

## 2020-12-11 NOTE — Telephone Encounter (Signed)
Addressed in phone encounter

## 2020-12-11 NOTE — Telephone Encounter (Signed)
Called patient and notified her that she has been scheduled for surgery on Wednesday 12/16/20 per Dr. Erlene Quan. Right URS biopsy w/ retrogrades, stent exchange and possible balloon dilation of ureteral stricture. Per PAT patient does not need another pre-admit apt, she was notified to just call the day before for arrival time. Patient verbalized understanding.   Also addressed patient's my chart message regarding trouble swallowing, mostly with taking medications. She was encouraged to drink more water when doing so to help with this. She is experiencing dryness with taking the oxybutinin. Dry mouth is a common side effect. She also states she is very constipated, she has not had a bowel movement since surgery 12/07/20. She has tried miralax and another OTC stool softener. She was instructed to try Dulcolax and drink plenty of water.Then if this does not work to try Mag Citrate and then Fleets enema. If she does not have a bowel movement by Monday she should call back or see PCP. Patient verbalized understanding

## 2020-12-14 NOTE — Telephone Encounter (Signed)
Patient notified that due to OR schedule, her surgery date has changed to 12/17/20. Patient verbalized understanding

## 2020-12-16 MED ORDER — CEFAZOLIN SODIUM-DEXTROSE 2-4 GM/100ML-% IV SOLN
2.0000 g | INTRAVENOUS | Status: AC
Start: 2020-12-16 — End: 2020-12-17
  Administered 2020-12-17: 2 g via INTRAVENOUS

## 2020-12-16 MED ORDER — LACTATED RINGERS IV SOLN: INTRAVENOUS | Status: DC

## 2020-12-16 MED ORDER — CHLORHEXIDINE GLUCONATE 0.12 % MT SOLN
15.0000 mL | Freq: Once | OROMUCOSAL | Status: AC
  Administered 2020-12-17: 15 mL via OROMUCOSAL

## 2020-12-16 MED ORDER — ORAL CARE MOUTH RINSE: 15.0000 mL | Freq: Once | OROMUCOSAL | Status: AC

## 2020-12-17 ENCOUNTER — Ambulatory Visit: Payer: Self-pay | Admitting: Certified Registered Nurse Anesthetist

## 2020-12-17 ENCOUNTER — Ambulatory Visit: Payer: Self-pay

## 2020-12-17 ENCOUNTER — Ambulatory Visit
Admission: RE | Admit: 2020-12-17 | Discharge: 2020-12-17 | Disposition: A | Discharge status: Home / Self Care | Payer: Self-pay | Attending: Urology | Admitting: Urology

## 2020-12-17 ENCOUNTER — Encounter
Admission: RE | Disposition: A | Discharge status: Home / Self Care | Payer: Self-pay | Source: Home / Self Care | Attending: Urology

## 2020-12-17 DIAGNOSIS — F17210 Nicotine dependence, cigarettes, uncomplicated: Secondary | ICD-10-CM | POA: Insufficient documentation

## 2020-12-17 DIAGNOSIS — N132 Hydronephrosis with renal and ureteral calculous obstruction: Secondary | ICD-10-CM | POA: Insufficient documentation

## 2020-12-17 DIAGNOSIS — Z885 Allergy status to narcotic agent status: Secondary | ICD-10-CM | POA: Insufficient documentation

## 2020-12-17 DIAGNOSIS — Z880 Allergy status to penicillin: Secondary | ICD-10-CM | POA: Insufficient documentation

## 2020-12-17 DIAGNOSIS — N200 Calculus of kidney: Secondary | ICD-10-CM

## 2020-12-17 DIAGNOSIS — N135 Crossing vessel and stricture of ureter without hydronephrosis: Secondary | ICD-10-CM

## 2020-12-17 DIAGNOSIS — N130 Hydronephrosis with ureteropelvic junction obstruction: Secondary | ICD-10-CM | POA: Insufficient documentation

## 2020-12-17 DIAGNOSIS — K760 Fatty (change of) liver, not elsewhere classified: Secondary | ICD-10-CM | POA: Insufficient documentation

## 2020-12-17 DIAGNOSIS — Z6837 Body mass index (BMI) 37.0-37.9, adult: Secondary | ICD-10-CM | POA: Insufficient documentation

## 2020-12-17 DIAGNOSIS — Z79899 Other long term (current) drug therapy: Secondary | ICD-10-CM | POA: Insufficient documentation

## 2020-12-17 DIAGNOSIS — N1330 Unspecified hydronephrosis: Secondary | ICD-10-CM

## 2020-12-17 HISTORY — PX: CYSTOSCOPY WITH RETROGRADE PYELOGRAM, URETEROSCOPY AND STENT PLACEMENT: SHX5789

## 2020-12-17 HISTORY — PX: URETEROSCOPY WITH HOLMIUM LASER LITHOTRIPSY: SHX6645

## 2020-12-17 SURGERY — CYSTOURETEROSCOPY, WITH RETROGRADE PYELOGRAM AND STENT INSERTION
Anesthesia: General | Laterality: Right

## 2020-12-17 MED ORDER — ACETAMINOPHEN 10 MG/ML IV SOLN
INTRAVENOUS | Status: AC
  Filled 2020-12-17: qty 100

## 2020-12-17 MED ORDER — ROCURONIUM BROMIDE 100 MG/10ML IV SOLN
INTRAVENOUS | Status: DC | PRN
  Administered 2020-12-17: 50 mg via INTRAVENOUS

## 2020-12-17 MED ORDER — FENTANYL CITRATE (PF) 100 MCG/2ML IJ SOLN
INTRAMUSCULAR | Status: DC | PRN
  Administered 2020-12-17 (×2): 50 ug via INTRAVENOUS

## 2020-12-17 MED ORDER — FENTANYL CITRATE (PF) 100 MCG/2ML IJ SOLN
INTRAMUSCULAR | Status: AC
  Filled 2020-12-17: qty 2

## 2020-12-17 MED ORDER — SUGAMMADEX SODIUM 200 MG/2ML IV SOLN
INTRAVENOUS | Status: DC | PRN
  Administered 2020-12-17: 200 mg via INTRAVENOUS

## 2020-12-17 MED ORDER — ONDANSETRON HCL 4 MG/2ML IJ SOLN
INTRAMUSCULAR | Status: AC
  Filled 2020-12-17: qty 2

## 2020-12-17 MED ORDER — DEXAMETHASONE SODIUM PHOSPHATE 10 MG/ML IJ SOLN
INTRAMUSCULAR | Status: DC | PRN
  Administered 2020-12-17: 10 mg via INTRAVENOUS

## 2020-12-17 MED ORDER — FENTANYL CITRATE (PF) 100 MCG/2ML IJ SOLN
25.0000 ug | INTRAMUSCULAR | Status: DC | PRN
  Administered 2020-12-17: 50 ug via INTRAVENOUS
  Administered 2020-12-17: 25 ug via INTRAVENOUS

## 2020-12-17 MED ORDER — ACETAMINOPHEN 10 MG/ML IV SOLN
INTRAVENOUS | Status: DC | PRN
  Administered 2020-12-17: 1000 mg via INTRAVENOUS

## 2020-12-17 MED ORDER — MIDAZOLAM HCL 2 MG/2ML IJ SOLN
INTRAMUSCULAR | Status: AC
  Filled 2020-12-17: qty 2

## 2020-12-17 MED ORDER — PROPOFOL 10 MG/ML IV BOLUS
INTRAVENOUS | Status: AC
  Filled 2020-12-17: qty 20

## 2020-12-17 MED ORDER — HYDROCODONE-ACETAMINOPHEN 5-325 MG PO TABS: 1.0000 | ORAL_TABLET | Freq: Four times a day (QID) | ORAL | 0 refills | Status: AC | PRN

## 2020-12-17 MED ORDER — CHLORHEXIDINE GLUCONATE 0.12 % MT SOLN
OROMUCOSAL | Status: AC
  Filled 2020-12-17: qty 15

## 2020-12-17 MED ORDER — MEPERIDINE HCL 25 MG/ML IJ SOLN: 6.2500 mg | INTRAMUSCULAR | Status: DC | PRN

## 2020-12-17 MED ORDER — LIDOCAINE HCL (CARDIAC) PF 100 MG/5ML IV SOSY
PREFILLED_SYRINGE | INTRAVENOUS | Status: DC | PRN
  Administered 2020-12-17: 100 mg via INTRAVENOUS

## 2020-12-17 MED ORDER — FENTANYL CITRATE (PF) 100 MCG/2ML IJ SOLN
INTRAMUSCULAR | Status: AC
  Administered 2020-12-17: 50 ug via INTRAVENOUS
  Filled 2020-12-17: qty 2

## 2020-12-17 MED ORDER — IOHEXOL 180 MG/ML  SOLN
INTRAMUSCULAR | Status: DC | PRN
  Administered 2020-12-17: 25 mL

## 2020-12-17 MED ORDER — PROPOFOL 10 MG/ML IV BOLUS
INTRAVENOUS | Status: DC | PRN
  Administered 2020-12-17: 170 mg via INTRAVENOUS

## 2020-12-17 MED ORDER — ROCURONIUM BROMIDE 10 MG/ML (PF) SYRINGE
PREFILLED_SYRINGE | INTRAVENOUS | Status: AC
  Filled 2020-12-17: qty 10

## 2020-12-17 MED ORDER — ONDANSETRON HCL 4 MG/2ML IJ SOLN: 4.0000 mg | Freq: Once | INTRAMUSCULAR | Status: DC | PRN

## 2020-12-17 MED ORDER — FENTANYL CITRATE (PF) 100 MCG/2ML IJ SOLN
INTRAMUSCULAR | Status: AC
  Administered 2020-12-17: 25 ug via INTRAVENOUS
  Filled 2020-12-17: qty 2

## 2020-12-17 MED ORDER — DEXAMETHASONE SODIUM PHOSPHATE 10 MG/ML IJ SOLN
INTRAMUSCULAR | Status: AC
  Filled 2020-12-17: qty 1

## 2020-12-17 MED ORDER — CEFAZOLIN SODIUM-DEXTROSE 2-4 GM/100ML-% IV SOLN
INTRAVENOUS | Status: AC
  Filled 2020-12-17: qty 100

## 2020-12-17 MED ORDER — ONDANSETRON HCL 4 MG/2ML IJ SOLN
INTRAMUSCULAR | Status: DC | PRN
  Administered 2020-12-17: 4 mg via INTRAVENOUS

## 2020-12-17 MED ORDER — MIDAZOLAM HCL 2 MG/2ML IJ SOLN
INTRAMUSCULAR | Status: DC | PRN
  Administered 2020-12-17: 2 mg via INTRAVENOUS

## 2020-12-17 SURGICAL SUPPLY — 23 items
BAG DRAIN CYSTO-URO LG1000N (MISCELLANEOUS) ×3 IMPLANT
BRUSH SCRUB EZ 1% IODOPHOR (MISCELLANEOUS) ×3 IMPLANT
CATH URET FLEX-TIP 2 LUMEN 10F (CATHETERS) ×3 IMPLANT
CATH URETL 5X70 OPEN END (CATHETERS) ×3 IMPLANT
CONRAY 43 FOR UROLOGY 50M (MISCELLANEOUS) ×3 IMPLANT
COVER WAND RF STERILE (DRAPES) ×3 IMPLANT
DRAPE UTILITY 15X26 TOWEL STRL (DRAPES) ×3 IMPLANT
GLOVE SURG ENC TEXT LTX SZ8 (GLOVE) ×3 IMPLANT
GOWN STRL REUS W/ TWL XL LVL3 (GOWN DISPOSABLE) ×2 IMPLANT
GOWN STRL REUS W/TWL XL LVL3 (GOWN DISPOSABLE) ×3
GUIDEWIRE GREEN .038 145CM (MISCELLANEOUS) ×3 IMPLANT
GUIDEWIRE STR DUAL SENSOR (WIRE) ×3 IMPLANT
IV NS IRRIG 3000ML ARTHROMATIC (IV SOLUTION) ×3 IMPLANT
KIT TURNOVER CYSTO (KITS) ×3 IMPLANT
MANIFOLD NEPTUNE II (INSTRUMENTS) ×3 IMPLANT
PACK CYSTO AR (MISCELLANEOUS) ×3 IMPLANT
SET CYSTO W/LG BORE CLAMP LF (SET/KITS/TRAYS/PACK) ×3 IMPLANT
STENT CONTOUR 7FRX24 (STENTS) ×3 IMPLANT
STENT URET 6FRX24 CONTOUR (STENTS) IMPLANT
STENT URET 6FRX26 CONTOUR (STENTS) IMPLANT
SURGILUBE 2OZ TUBE FLIPTOP (MISCELLANEOUS) ×3 IMPLANT
TRACTIP FLEXIVA PULSE ID 200 (Laser) ×3 IMPLANT
WATER STERILE IRR 1000ML POUR (IV SOLUTION) ×3 IMPLANT

## 2020-12-17 NOTE — Interval H&P Note (Signed)
H&P updated today  Regular rate and rhythm Clear to auscultation bilaterally  Underwent ureteroscopic placement of ureteral stent placement but unable to traverse the UPJ.  Returns today for staged procedure to dilate UPJ, possible biopsy and stent replacement.  All questions answered.

## 2020-12-17 NOTE — Op Note (Signed)
Date of procedure: 12/17/20  Preoperative diagnosis:  Right hydronephrosis Right UPJ stricture Right flank pain  Postoperative diagnosis:  Same as above Right kidney stones  Procedure: Right ureteral stent exchange Right diagnostic ureteroscopy Right laser lithotripsy of stones Right retrograde pyelogram Interpretation of fluoroscopy less than 30 minutes  Surgeon: Hollice Espy, MD  Anesthesia: General  Complications: None  Intraoperative findings: Very acute angles into normal infundibular, difficulty with navigate ureteroscopically but no obvious tumors, lesions, or any other obvious stricture disease contributing to the hydronephrosis.  Unusual collecting system anatomy.  2 small lower pole stones, measuring approximately 2 mm and 3 mm identified and obliterated.  EBL: Minimal  Specimens: None  Drains: 7 x 24 French double-J ureteral stent on right  Indication: Jennifer [name] is a 45 y.o. patient with right flank pain and hydronephrosis of unclear etiology.  She underwent attempted ureteroscopy last week but was unsuccessful the stent was positioned returns today for second look..  After reviewing the management options for treatment, she elected to proceed with the above surgical procedure(s). We have discussed the potential benefits and risks of the procedure, side effects of the proposed treatment, the likelihood of the patient achieving the goals of the procedure, and any potential problems that might occur during the procedure or recuperation. Informed consent has been obtained.  Description of procedure:  The patient was taken to the operating room and general anesthesia was induced.  The patient was placed in the dorsal lithotomy position, prepped and draped in the usual sterile fashion, and preoperative antibiotics were administered. A preoperative time-out was performed.   21 French the scope was advanced per urethra into the bladder.  Attention was turned to the  right ureteral orifice which a ureteral stent was seen emanating.  The distal coil the stent was grasped and brought to the level of the urethral meatus.  This was then cannulated using a sensor wire up to the level of the kidney.  A dual-lumen sheath and this is used fluoroscopically to introduce a second Super Stiff wire as a working wire.  These went easily in the sensor wire snapped in place as a safety wire.  I then used a single channel 7 French ureteroscope over the Super Stiff wire into the lower pole moiety which went quite easily under fluoroscopic guidance.  It bypassed the UPJ without any issues whatsoever.  Within his calyx, small stone was identified, approximately 3 mm in navigating into the extreme lower pole, a second 2 mm stone was encountered.  I backed to the level of the UPJ and inspected closely.  It was actually quite easy to navigate and there was no obvious stricture disease here.  Shot another retrograde just distal to this and it remained patent and draining promptly.  So that the lower pole portion of the collecting system however the upper pole had retained contrast.  To navigate my scope into the upper pole compound calyces but had extreme difficulty initially doing so.  Ultimately, I identified the opening which is relatively stenotic into the upper pole with which had a very acute angle almost doubling back on myself in order to enter this portion of the collecting system.  I was unable to navigate  into all the other calyces through this opening.  No additional pathology was identified.  We then navigated back into the lower pole calyx and a 242 micro laser fiber using the settings of 0.3 J and 60 Hz was used to dust these small stones  into particles no larger than the tip of the laser fiber.  1 last time, I backed to level the proximal ureter, shot another retrograde and the renal pelvis remained widely patent and drained promptly.  At this point, there is no clear indication for  biopsy or balloon dilation.  As such, I backed the scope down the length of the ureter inspecting along the way.  There is no additional injury or abrasions appreciated.  I elected to place a 7 x 24 French stent for further dilation of the UPJ as a precaution.  This was placed over the sensor wire.  Upon removal of the wire there is a partial coil within the lower pole portion of the kidney as well as within the bladder.  The bladder was then drained.  The patient was then cleaned and dried, repositioned in supine position, reversed myesthesia, and taken to PACU in stable condition.  Plan: We will have her return in 2 weeks for cystoscopy, stent removal.  Ultimately, we will have her follow-up in about 6 weeks thereafter with renal ultrasound.  If her hydronephrosis persists, we will plan for Lasix renal scan to assess for ongoing obstruction.  It appears that this is likely just unusual collecting system anatomy rather than true stricture or UPJ obstruction.  Hollice Espy, M.D.

## 2020-12-17 NOTE — Anesthesia Postprocedure Evaluation (Signed)
Anesthesia Post Note  Patient: Production assistant, radio  Procedure(s) Performed: CYSTOSCOPY WITH RETROGRADE PYELOGRAM, URETEROSCOPY AND STENT EXCHANGE (Right) URETEROSCOPY WITH HOLMIUM LASER LITHOTRIPSY  Patient location during evaluation: PACU Anesthesia Type: General Level of consciousness: awake and alert Pain management: pain level controlled Vital Signs Assessment: post-procedure vital signs reviewed and stable Respiratory status: spontaneous breathing, nonlabored ventilation, respiratory function stable and patient connected to nasal cannula oxygen Cardiovascular status: blood pressure returned to baseline and stable Postop Assessment: no apparent nausea or vomiting Anesthetic complications: no   No notable events documented.   Last Vitals:  Vitals:   12/17/20 1625 12/17/20 1637  BP: 121/73 125/64  Pulse: 63 (!) 59  Resp: 16 18  Temp: (!) 36.3 C (!) 36.1 C  SpO2: 94% 95%    Last Pain:  Vitals:   12/17/20 1637  TempSrc: Temporal  PainSc: 0-No pain                 Arita Miss

## 2020-12-17 NOTE — Anesthesia Procedure Notes (Signed)
Procedure Name: Intubation Date/Time: 12/17/2020 2:47 PM Performed by: Aline Brochure, CRNA Pre-anesthesia Checklist: Patient identified, Patient being monitored, Timeout performed, Emergency Drugs available and Suction available Patient Re-evaluated:Patient Re-evaluated prior to induction Oxygen Delivery Method: Circle system utilized Preoxygenation: Pre-oxygenation with 100% oxygen Induction Type: IV induction Ventilation: Mask ventilation without difficulty Laryngoscope Size: 3 and McGraph Grade View: Grade I Tube type: Oral Tube size: 7.5 mm Number of attempts: 1 Airway Equipment and Method: Stylet and Video-laryngoscopy Placement Confirmation: ETT inserted through vocal cords under direct vision, positive ETCO2 and breath sounds checked- equal and bilateral Secured at: 21 cm Tube secured with: Tape Dental Injury: Teeth and Oropharynx as per pre-operative assessment

## 2020-12-17 NOTE — Anesthesia Preprocedure Evaluation (Signed)
Anesthesia Evaluation  Patient identified by MRN, date of birth, ID band Patient awake    Reviewed: Allergy & Precautions, H&P , NPO status , Patient's Chart, lab work & pertinent test results, reviewed documented beta blocker date and time   History of Anesthesia Complications Negative for: history of anesthetic complications  Airway Mallampati: III  TM Distance: >3 FB Neck ROM: full    Dental  (+) Dental Advidsory Given, Teeth Intact   Pulmonary neg shortness of breath, neg COPD, neg recent URI, Current Smoker and Patient abstained from smoking.,    Pulmonary exam normal breath sounds clear to auscultation       Cardiovascular Exercise Tolerance: Good negative cardio ROS Normal cardiovascular exam Rhythm:regular Rate:Normal     Neuro/Psych PSYCHIATRIC DISORDERS Anxiety Depression negative neurological ROS     GI/Hepatic Neg liver ROS, GERD  ,  Endo/Other  neg diabetesMorbid obesity  Renal/GU Renal disease (kidney stones)  negative genitourinary   Musculoskeletal   Abdominal   Peds  Hematology negative hematology ROS (+)   Anesthesia Other Findings Past Medical History: No date: Depression No date: Uterine fibroid   Reproductive/Obstetrics negative OB ROS                             Anesthesia Physical  Anesthesia Plan  ASA: 3  Anesthesia Plan: General   Post-op Pain Management:    Induction: Intravenous  PONV Risk Score and Plan: 3 and Ondansetron, Dexamethasone, Midazolam, Promethazine and Treatment may vary due to age or medical condition  Airway Management Planned: LMA and Oral ETT  Additional Equipment:   Intra-op Plan:   Post-operative Plan: Extubation in OR  Informed Consent: I have reviewed the patients History and Physical, chart, labs and discussed the procedure including the risks, benefits and alternatives for the proposed anesthesia with the patient or  authorized representative who has indicated his/her understanding and acceptance.     Dental Advisory Given  Plan Discussed with: Anesthesiologist, CRNA and Surgeon  Anesthesia Plan Comments:         Anesthesia Quick Evaluation

## 2020-12-17 NOTE — Transfer of Care (Signed)
Immediate Anesthesia Transfer of Care Note  Patient: Jennifer [name]  Procedure(s) Performed: CYSTOSCOPY WITH RETROGRADE PYELOGRAM, URETEROSCOPY AND STENT EXCHANGE (Right) URETEROSCOPY WITH HOLMIUM LASER LITHOTRIPSY  Patient Location: PACU  Anesthesia Type:General  Level of Consciousness: awake  Airway & Oxygen Therapy: Patient Spontanous Breathing and Patient connected to face mask oxygen  Post-op Assessment: Post -op Vital signs reviewed and stable  Post vital signs: Reviewed and stable  Last Vitals:  Vitals Value Taken Time  BP 143/74   Temp    Pulse 65 12/17/20 1538  Resp 18 12/17/20 1538  SpO2 100 % 12/17/20 1538  Vitals shown include unvalidated device data.  Last Pain:  Vitals:   12/17/20 1147  TempSrc: Temporal  PainSc: 3          Complications: No notable events documented.

## 2020-12-17 NOTE — Discharge Instructions (Addendum)
You have a ureteral stent in place.  This is a tube that extends from your kidney to your bladder.  This may cause urinary bleeding, burning with urination, and urinary frequency.  Please call our office or present to the ED if you develop fevers >101 or pain which is not able to be controlled with oral pain medications.  You may be given either Flomax and/ or ditropan to help with bladder spasms and stent pain in addition to pain medications.    [facility] [address], [address] [place], [place] [address] [phone]  AMBULATORY SURGERY  DISCHARGE INSTRUCTIONS   The drugs that you were given will stay in your system until tomorrow so for the next 24 hours you should not:  Drive an automobile Make any legal decisions Drink any alcoholic beverage   You may resume regular meals tomorrow.  Today it is better to start with liquids and gradually work up to solid foods.  You may eat anything you prefer, but it is better to start with liquids, then soup and crackers, and gradually work up to solid foods.   Please notify your doctor immediately if you have any unusual bleeding, trouble breathing, redness and pain at the surgery site, drainage, fever, or pain not relieved by medication.    Additional Instructions:   Please contact your physician with any problems or Same Day Surgery at [phone], Monday through Friday 6 am to 4 pm, or [place] [facility] [place] Main number at [phone].  

## 2020-12-18 ENCOUNTER — Encounter: Payer: Self-pay | Admitting: Urology

## 2020-12-21 ENCOUNTER — Other Ambulatory Visit: Payer: Self-pay

## 2020-12-21 DIAGNOSIS — N1330 Unspecified hydronephrosis: Secondary | ICD-10-CM

## 2020-12-21 DIAGNOSIS — N135 Crossing vessel and stricture of ureter without hydronephrosis: Secondary | ICD-10-CM

## 2020-12-22 ENCOUNTER — Telehealth: Payer: Self-pay

## 2020-12-22 MED ORDER — TAMSULOSIN HCL 0.4 MG PO CAPS: 0.4000 mg | ORAL_CAPSULE | Freq: Every day | ORAL | 0 refills | Status: AC

## 2020-12-22 MED ORDER — PHENAZOPYRIDINE HCL 200 MG PO TABS: 200.0000 mg | ORAL_TABLET | Freq: Three times a day (TID) | ORAL | 0 refills | Status: AC | PRN

## 2020-12-22 NOTE — Telephone Encounter (Signed)
Called patient in regards to mychart message stating that she was not tolerating her stent well. Patient confirms that she is taking the oxybutinin script and myrbetriq samples given to help with bladder spasms but is still having severe pain/discomfort in her pelvic/bladder and dysuria. Per Sam ok to send in Tamsulosin and Pyridium to help with spasm pain and pyridium to help with burning. Patient was instructed to drink with a large glass of water. She was also encouraged to utilize OTC Nsaids and may alternate with tylenol for pain relief. She was given red food coloring example for blood reference in urine. Patient verbalized understanding

## 2020-12-30 ENCOUNTER — Encounter: Payer: Self-pay | Admitting: Urology

## 2020-12-30 ENCOUNTER — Ambulatory Visit (INDEPENDENT_AMBULATORY_CARE_PROVIDER_SITE_OTHER): Payer: Self-pay | Admitting: Urology

## 2020-12-30 ENCOUNTER — Other Ambulatory Visit: Payer: Self-pay

## 2020-12-30 VITALS — BP 114/80 | HR 80

## 2020-12-30 DIAGNOSIS — N1330 Unspecified hydronephrosis: Secondary | ICD-10-CM

## 2020-12-30 LAB — MICROSCOPIC EXAMINATION: RBC, Urine: >30 /hpf — ABNORMAL HIGH (ref 0–2)

## 2020-12-30 LAB — URINALYSIS, COMPLETE
Bilirubin, UA: NEGATIVE
Glucose, UA: NEGATIVE
Ketones, UA: NEGATIVE
Nitrite, UA: NEGATIVE
Specific Gravity, UA: 1.025 (ref 1.005–1.030)
Urobilinogen, Ur: 0.2 mg/dL (ref 0.2–1.0)
pH, UA: 6 (ref 5.0–7.5)

## 2020-12-30 MED ORDER — SULFAMETHOXAZOLE-TRIMETHOPRIM 800-160 MG PO TABS
1.0000 | ORAL_TABLET | Freq: Once | ORAL | Status: AC
  Administered 2020-12-30: 1 via ORAL

## 2020-12-30 NOTE — Progress Notes (Signed)
   12/30/20  CC:  Chief Complaint  Patient presents with   Cysto Stent Removal    HPI: 45 year old female with right hydronephrosis status post ureteroscopy x2 as well as treatment of a small stone returns today for cystoscopic removal of her stent.  Please see previous op note for details.  Initially, she is having some difficulty tolerating the stent which improved.  Her primary complaint today is pelvic pressure.  Blood pressure 114/80, pulse 80, last menstrual period 08/22/2019. NED. A&Ox3.   No respiratory distress   Abd soft, NT, ND Normal external genitalia with patent urethral meatus  Cystoscopy/ Stent removal procedure  Patient identification was confirmed, informed consent was obtained, and patient was prepped using Betadine solution.  Lidocaine jelly was administered per urethral meatus.    Preoperative abx where received prior to procedure.    Procedure: - Flexible cystoscope introduced, without any difficulty.   - Thorough search of the bladder revealed:    normal urethral meatus  Stent seen emanating from  right  ureteral orifice, grasped with stent graspers, and removed in entirety.    Post-Procedure: - Patient tolerated the procedure well   Assessment/ Plan:  1. Hydronephrosis, unspecified hydronephrosis type Etiology remains clear but is likely related to her anatomic variant or punctate unappreciated stones on CT scan  No obvious strictures or intraluminal obstruction from solid lesion which is reassuring  Stent removed today without difficulty- warning symptoms reviewed  We will have her follow-up in 6 weeks with renal ultrasound prior.  If her hydronephrosis fails to resolve, we will likely order Lasix renogram to assess for persistent or degree of obstruction.  She is agreeable this plan. - Urinalysis, Complete - sulfamethoxazole-trimethoprim (BACTRIM DS) 800-160 MG per tablet 1 tablet   Hollice Espy, MD

## 2021-01-25 ENCOUNTER — Ambulatory Visit: Payer: Self-pay | Attending: Urology

## 2021-02-08 ENCOUNTER — Encounter: Payer: Self-pay | Admitting: *Deleted

## 2021-02-08 NOTE — Telephone Encounter (Signed)
Left VM to reschedule ultrasound

## 2021-02-09 NOTE — Progress Notes (Incomplete)
02/09/21 9:01 AM   Jennifer [name] 1976-06-01 [id number]  Referring provider:  Donnie Coffin, MD [place],  [place] [address] No chief complaint on file.    HPI: Jennifer [name] is a 45 y.o.female with right hydronephrosis status post ureteroscopy x2 as well as treatment of a small stone who returns today for 6 week follow-up with RUS.   RUS showed ***      PMH: Past Medical History:  Diagnosis Date   Anxiety    Depression    GERD (gastroesophageal reflux disease)    History of kidney stones    Hypothyroidism    Kidney stone    Uterine fibroid     Surgical History: Past Surgical History:  Procedure Laterality Date   CYSTOSCOPY  11/01/2019   Procedure: CYSTOSCOPY;  Surgeon: Schermerhorn, Gwen Her, MD;  Location: [facility];  Service: Gynecology;;   Consuela Mimes W/ RETROGRADES Bilateral 12/07/2020   Procedure: CYSTOSCOPY WITH RETROGRADE PYELOGRAM;  Surgeon: Hollice Espy, MD;  Location: [facility];  Service: Urology;  Laterality: Bilateral;   CYSTOSCOPY WITH RETROGRADE PYELOGRAM, URETEROSCOPY AND STENT PLACEMENT Right 12/17/2020   Procedure: CYSTOSCOPY WITH RETROGRADE PYELOGRAM, URETEROSCOPY AND STENT EXCHANGE;  Surgeon: Hollice Espy, MD;  Location: [facility];  Service: Urology;  Laterality: Right;   CYSTOSCOPY WITH STENT PLACEMENT Right 12/07/2020   Procedure: CYSTOSCOPY WITH STENT PLACEMENT;  Surgeon: Hollice Espy, MD;  Location: [facility];  Service: Urology;  Laterality: Right;   EXTRACORPOREAL SHOCK WAVE LITHOTRIPSY Right 11/21/2019   Procedure: EXTRACORPOREAL SHOCK WAVE LITHOTRIPSY (ESWL);  Surgeon: Billey Co, MD;  Location: [facility];  Service: Urology;  Laterality: Right;   HYSTERECTOMY ABDOMINAL WITH SALPINGO-OOPHORECTOMY Bilateral 11/01/2019   Procedure: HYSTERECTOMY ABDOMINAL WITH SALPINGECTOMY;  Surgeon: Schermerhorn, Gwen Her, MD;  Location: [facility];  Service: Gynecology;  Laterality: Bilateral;   URETEROSCOPY Right 12/07/2020    Procedure: URETEROSCOPY;  Surgeon: Hollice Espy, MD;  Location: [facility];  Service: Urology;  Laterality: Right;   URETEROSCOPY WITH HOLMIUM LASER LITHOTRIPSY  12/17/2020   Procedure: URETEROSCOPY WITH HOLMIUM LASER LITHOTRIPSY;  Surgeon: Hollice Espy, MD;  Location: [facility];  Service: Urology;;    Home Medications:  Allergies as of 02/10/2021       Reactions   Amoxicillin Swelling   Hydrocodone Other (See Comments)   headache   Oxybutynin    Extreme dry mouth   Fentanyl Itching, Rash        Medication List        Accurate as of February 09, 2021  9:01 AM. If you have any questions, ask your nurse or doctor.          atorvastatin 10 MG tablet Commonly known as: LIPITOR Take 10 mg by mouth at bedtime.   gabapentin 300 MG capsule Commonly known as: NEURONTIN Take 300 mg by mouth 2 (two) times daily.   HYDROcodone-acetaminophen 5-325 MG tablet Commonly known as: NORCO/VICODIN Take 1-2 tablets by mouth every 6 (six) hours as needed for moderate pain.   ibuprofen 200 MG tablet Commonly known as: ADVIL Take 800 mg by mouth every 8 (eight) hours as needed for moderate pain.   levocetirizine 5 MG tablet Commonly known as: XYZAL Take 5 mg by mouth daily.   levothyroxine 50 MCG tablet Commonly known as: SYNTHROID Take 50 mcg by mouth daily before breakfast.   mirabegron ER 50 MG Tb24 tablet Commonly known as: MYRBETRIQ Take 1 tablet (50 mg total) by mouth daily.   multivitamin with minerals tablet  Take 1 tablet by mouth daily.   omeprazole 40 MG capsule Commonly known as: PRILOSEC Take 40 mg by mouth daily.   oxybutynin 5 MG tablet Commonly known as: DITROPAN Take 1 tablet (5 mg total) by mouth every 8 (eight) hours as needed for bladder spasms.   phenazopyridine 200 MG tablet Commonly known as: Pyridium Take 1 tablet (200 mg total) by mouth 3 (three) times daily as needed for pain.   ProAir HFA 108 (90 Base) MCG/ACT inhaler Generic drug:  albuterol Inhale 2 puffs into the lungs every 4 (four) hours as needed for wheezing or shortness of breath.   PROBIOTIC DAILY PO Take 1 capsule by mouth daily.   sertraline 100 MG tablet Commonly known as: ZOLOFT Take 100 mg by mouth at bedtime.   tamsulosin 0.4 MG Caps capsule Commonly known as: FLOMAX Take 1 capsule (0.4 mg total) by mouth daily.        Allergies:  Allergies  Allergen Reactions   Amoxicillin Swelling   Hydrocodone Other (See Comments)    headache   Oxybutynin     Extreme dry mouth   Fentanyl Itching and Rash    Family History: No family history on file.  Social History:  reports that she has been smoking cigarettes. She has been smoking an average of 1 pack per day. She has never used smokeless tobacco. She reports that she does not drink alcohol and does not use drugs.   Physical Exam: LMP 08/22/2019   Constitutional:  Alert and oriented, No acute distress. HEENT: [place] AT, moist mucus membranes.  Trachea midline, no masses. Cardiovascular: No clubbing, cyanosis, or edema. Respiratory: Normal respiratory effort, no increased work of breathing. GI: Abdomen is soft, nontender, nondistended, no abdominal masses GU: No CVA tenderness Lymph: No cervical or inguinal lymphadenopathy. Skin: No rashes, bruises or suspicious lesions. Neurologic: Grossly intact, no focal deficits, moving all 4 extremities. Psychiatric: Normal mood and affect.  Laboratory Data:  Lab Results  Component Value Date   CREATININE 0.93 06/13/2020    Urinalysis   Pertinent Imaging: RUS ***   Assessment & Plan:      No follow-ups on file.  I,Kailey Littlejohn,acting as a Education administrator for Hollice Espy, MD.,have documented all relevant documentation on the behalf of Hollice Espy, MD,as directed by  Hollice Espy, MD while in the presence of Hollice Espy, [place] [address] [place],  [address] [phone]

## 2021-02-10 ENCOUNTER — Ambulatory Visit: Payer: Self-pay | Admitting: Urology

## 2021-02-12 ENCOUNTER — Encounter: Payer: Self-pay | Admitting: Urology

## 2021-08-30 IMAGING — CR DG CHEST 2V
2 series · 2 of 2 positions shown · non-contrast
Comparison: August 31, 2019

CLINICAL DATA: Shortness of breath.

EXAM:
CHEST - 2 VIEW

[chest pa]
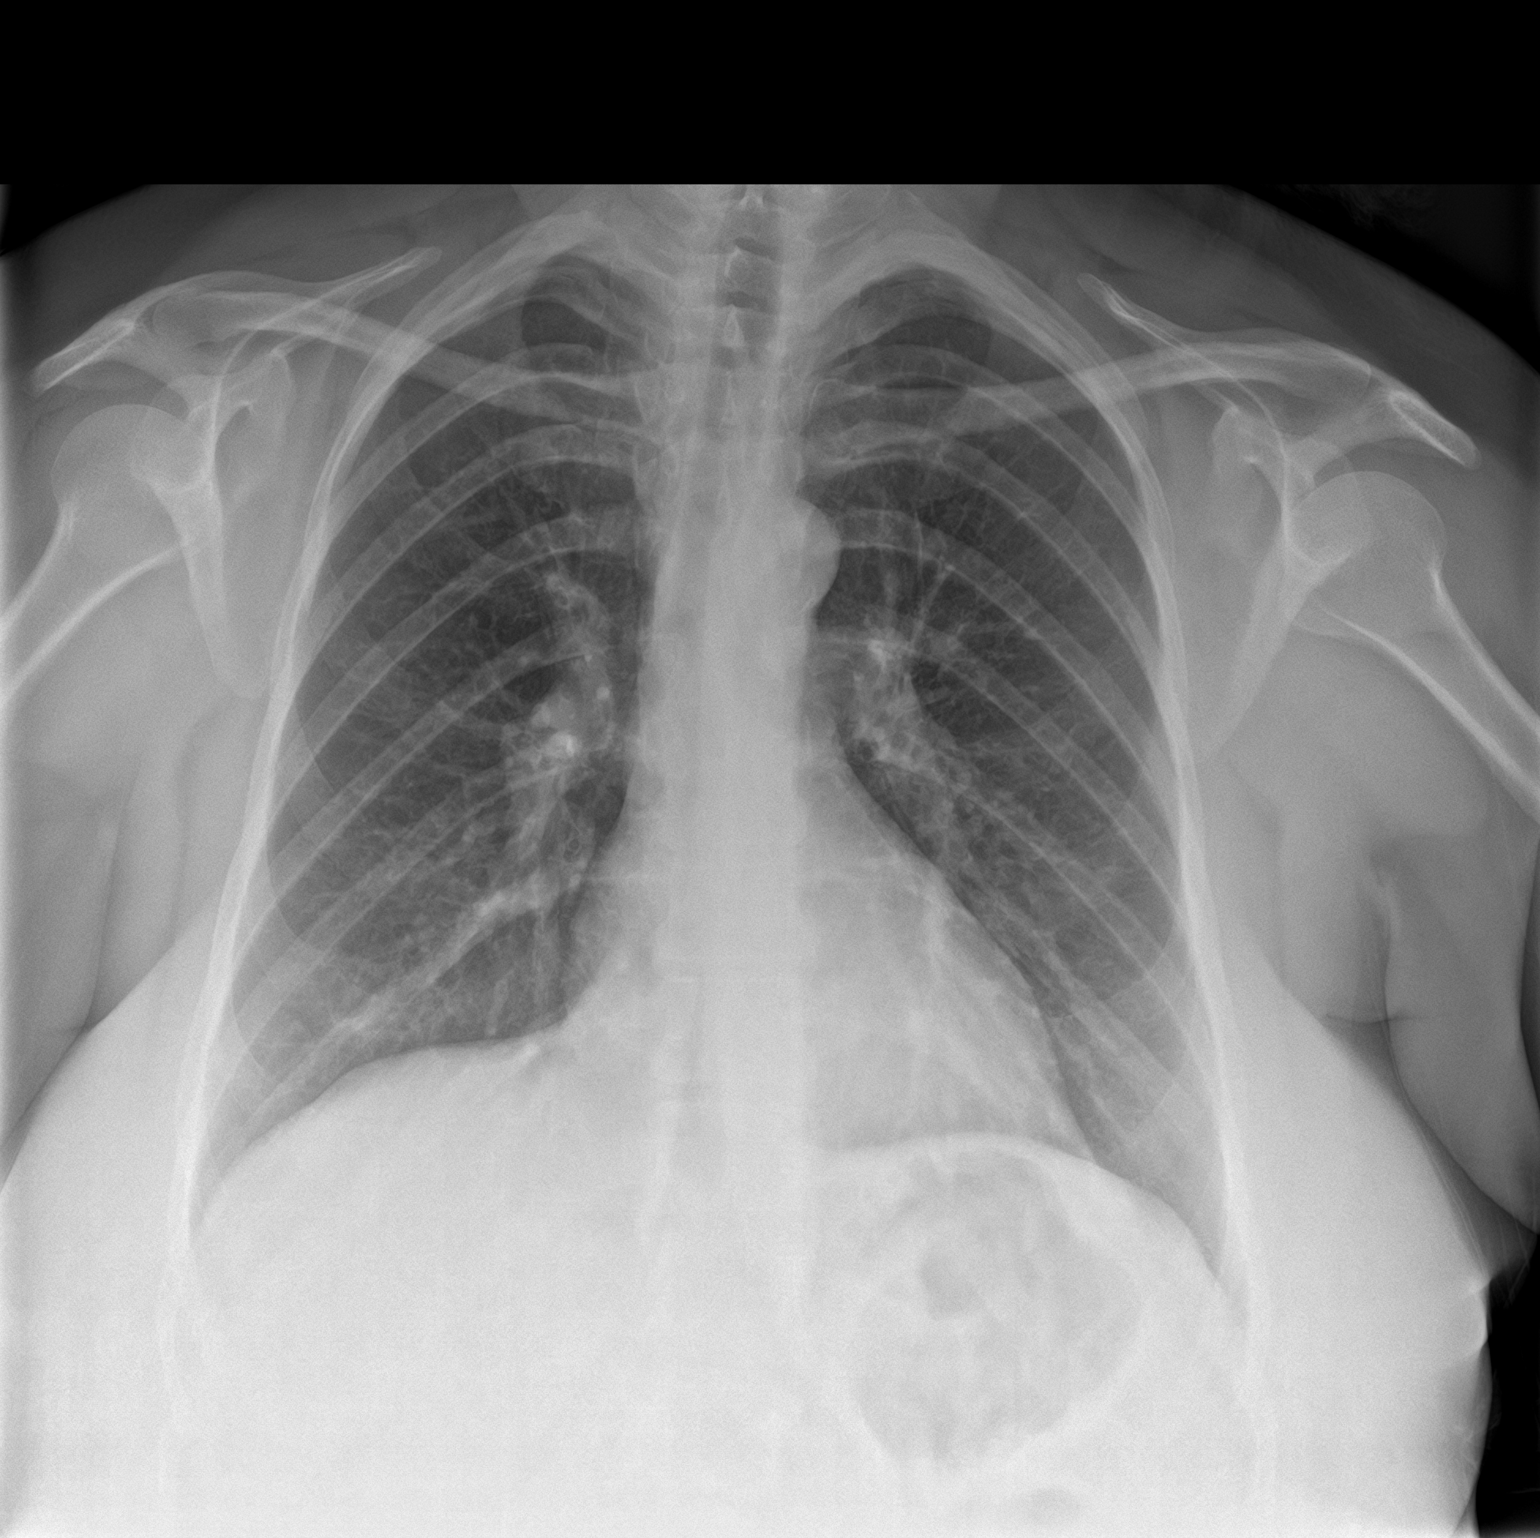

[chest lat]
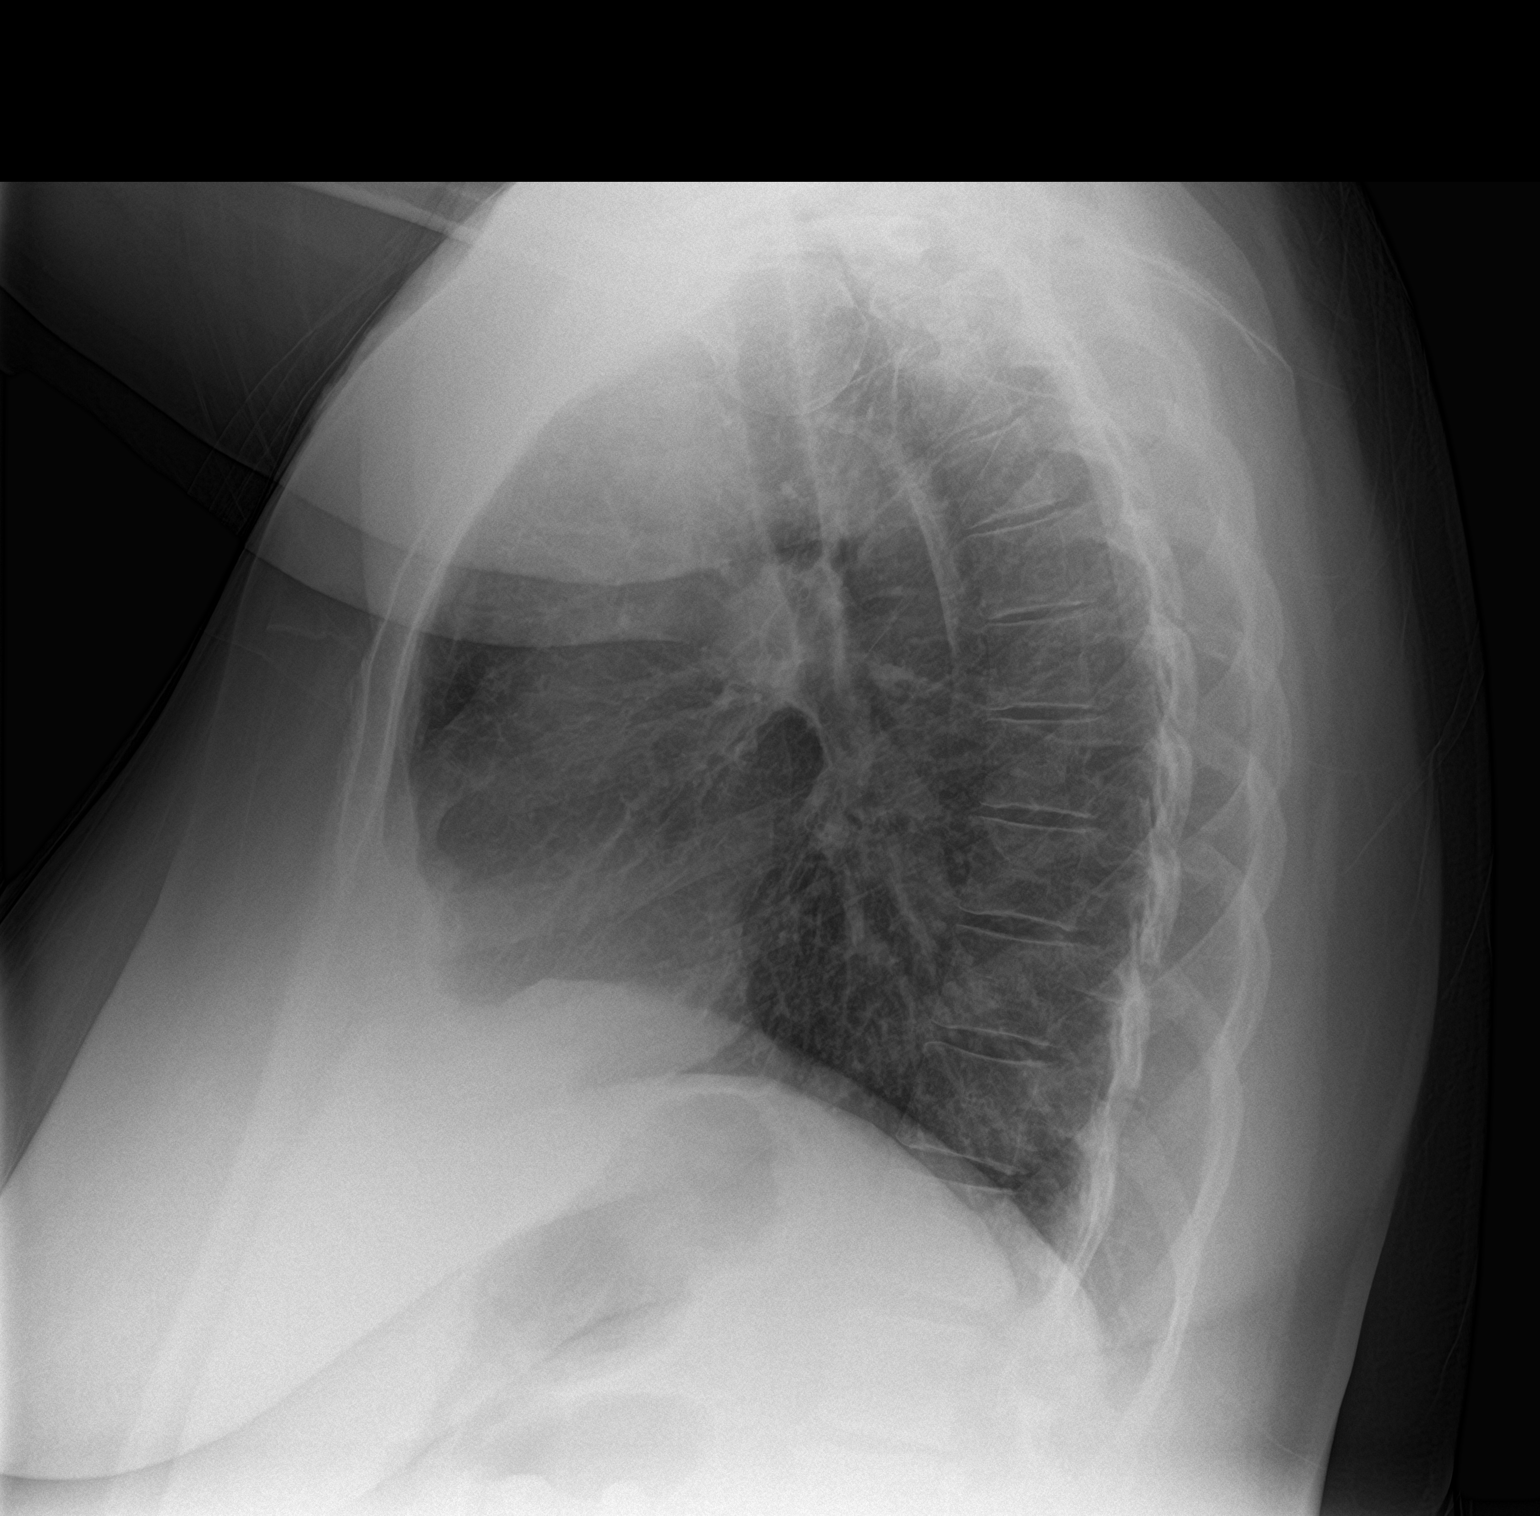

[2 of 2 positions shown; findings below may reference images not displayed]

FINDINGS: The heart size is unremarkable. There is no pneumothorax. No large
pleural effusion. There is a subtle opacity at the right lung base
which may represent atelectasis or a developing infiltrate. Aortic
calcifications are noted.
IMPRESSION: 1. Subtle opacity at the right lung base may represent a developing
infiltrate or atelectasis. A 4-6 week follow-up two-view chest x-ray
is recommended to document resolution of this finding.
2. Otherwise, unremarkable chest x-ray.

## 2021-12-15 IMAGING — CR DG ABDOMEN 1V
1 series · 2 of 2 positions shown · non-contrast
Comparison: Most recent abdominal radiograph 06/04/2020. Most
recent CT 10/30/2019

CLINICAL DATA: Kidney stones.  Patient reports right-sided pain.

EXAM:
ABDOMEN - 1 VIEW

[Series 1: dg abd 1 view · 0.14mm/px · 2 of 2 slices shown]
[im 1/2]
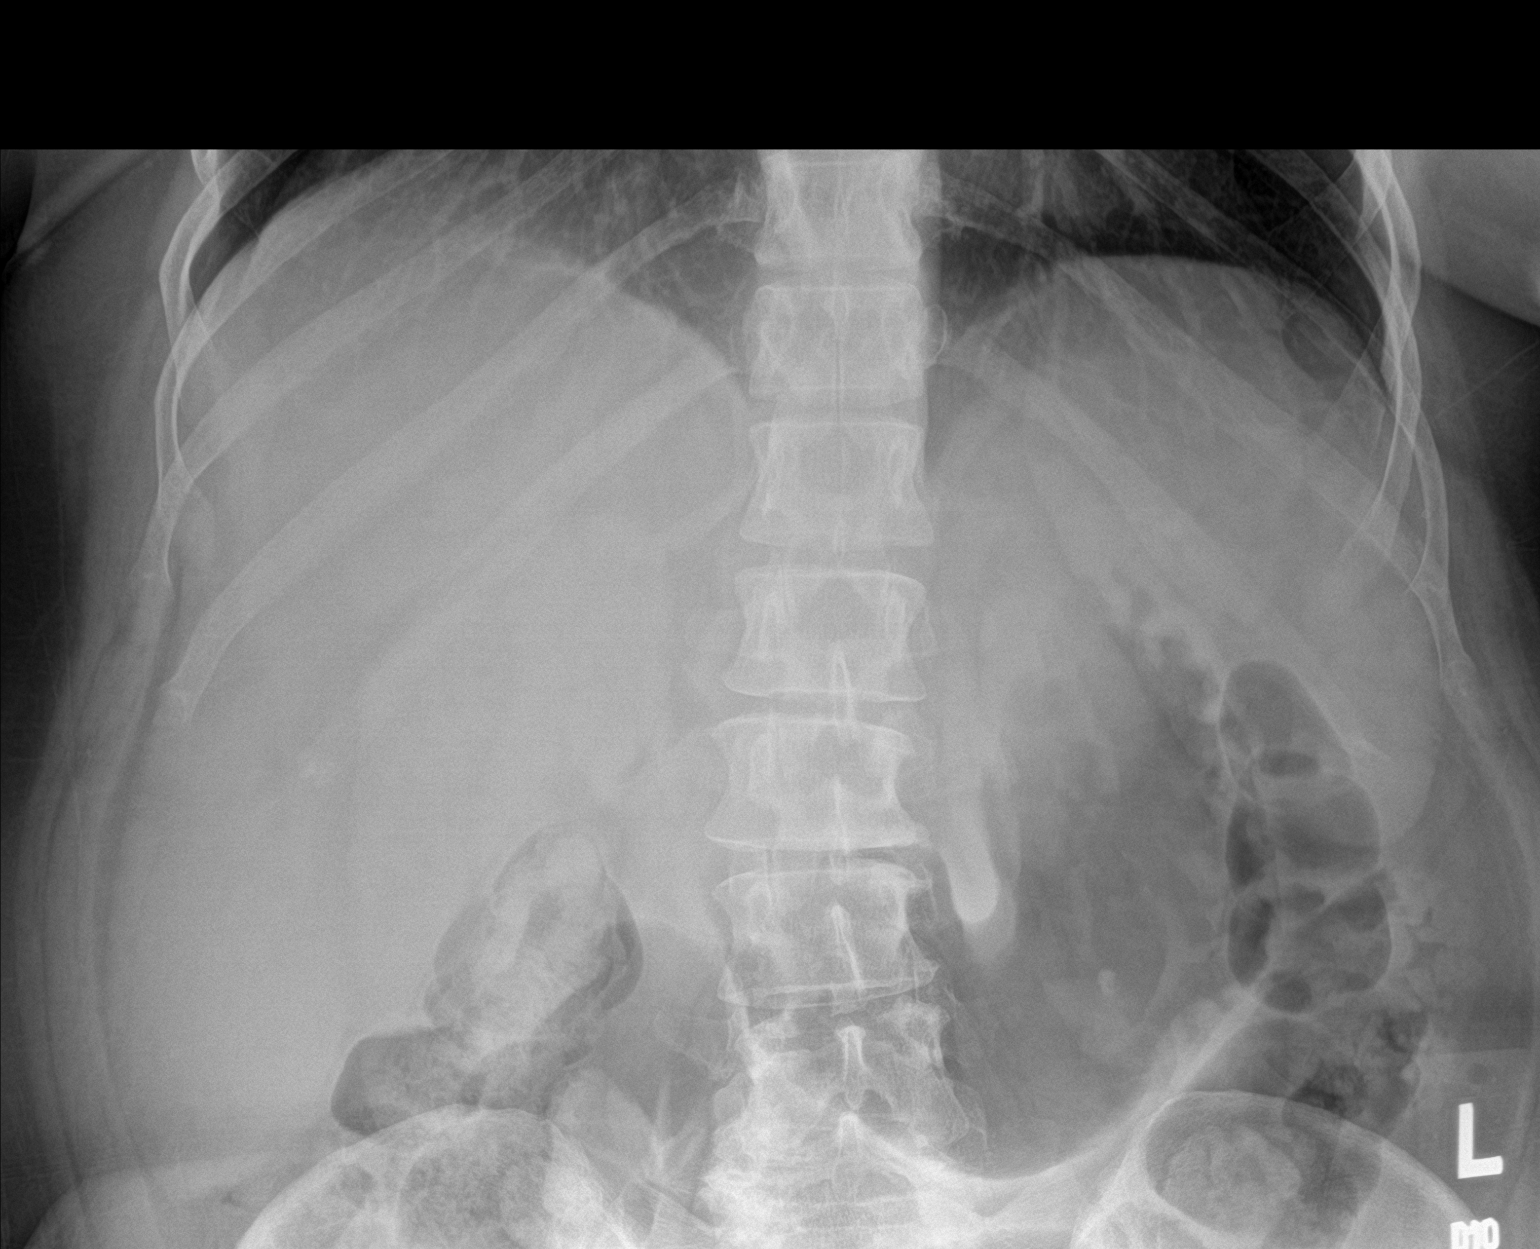
[im 2/2]
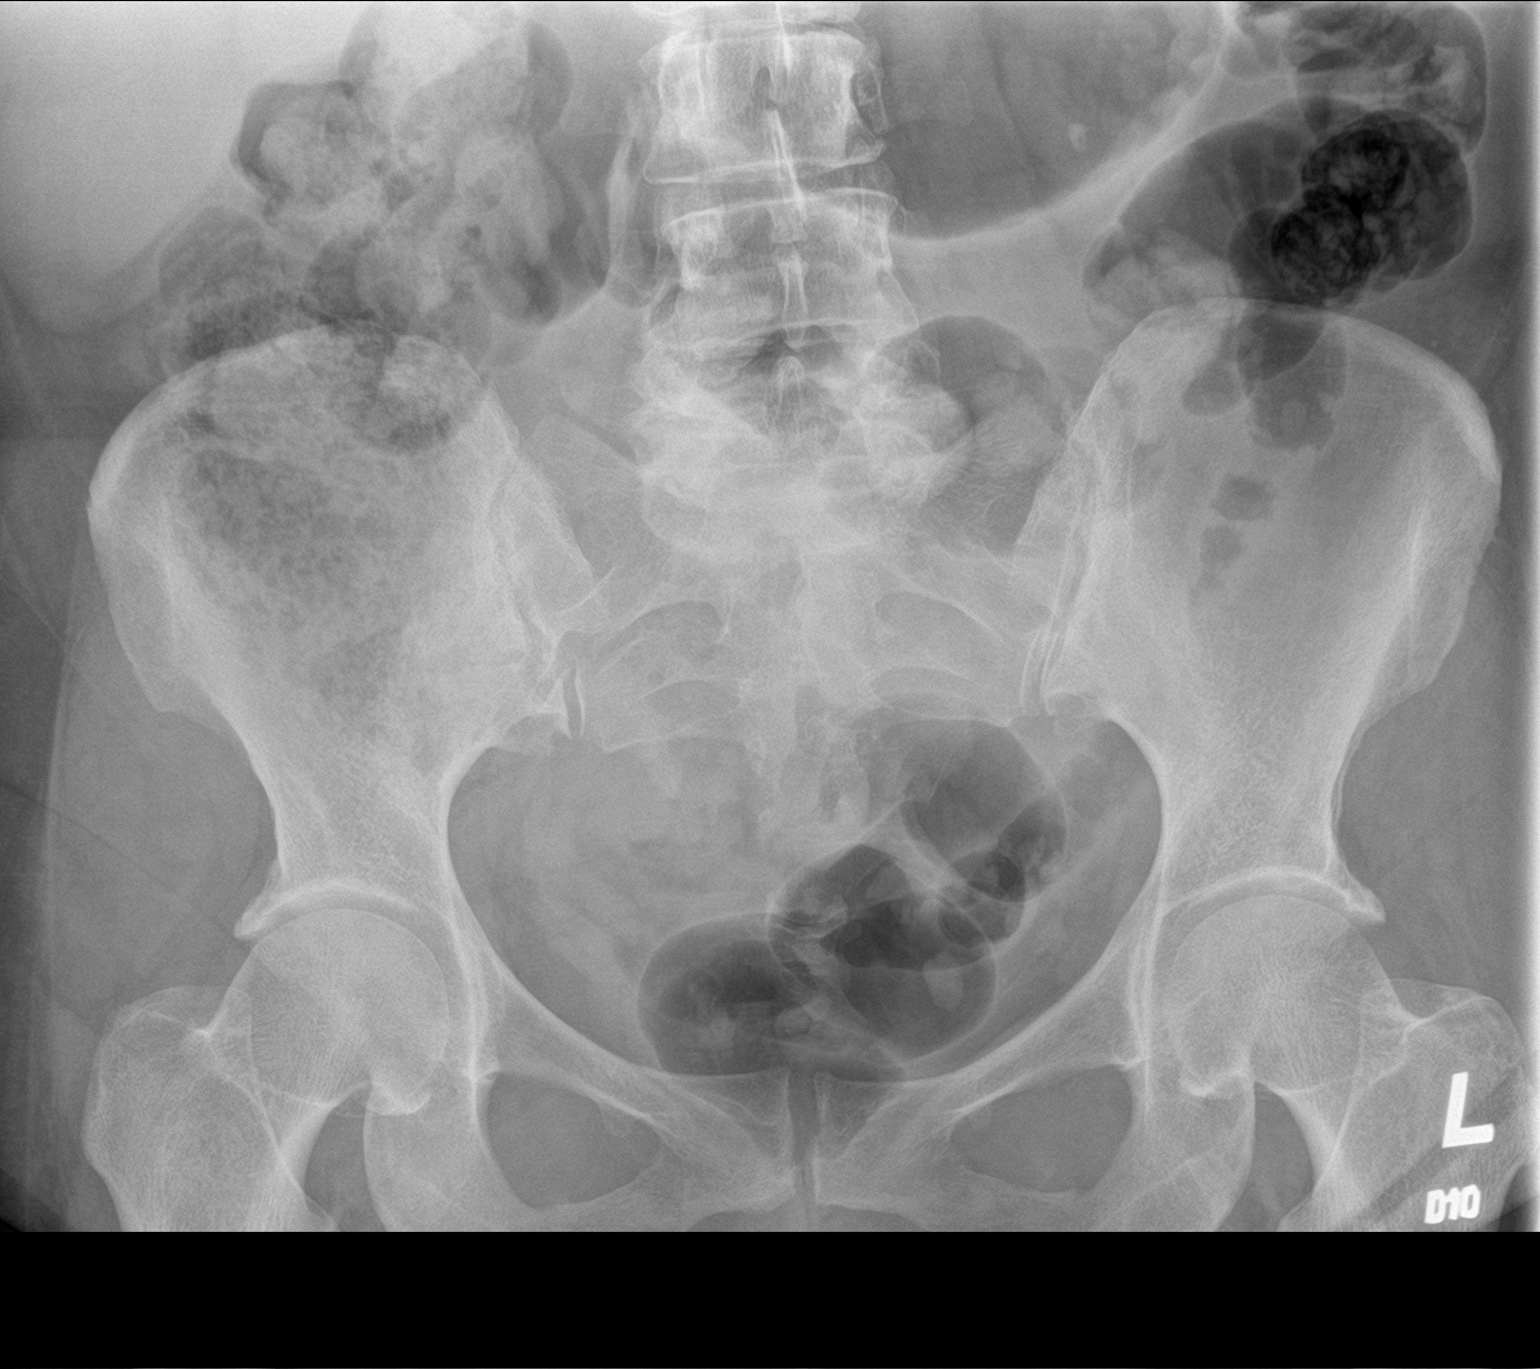

[2 of 2 positions shown; findings below may reference images not displayed]

FINDINGS: 6 mm stone projects over the lower left renal shadow, not
significantly changed from prior exam. No definitive right renal
calculi. No stones over the course of the ureters or in the pelvis.
Moderate stool in the right colon, small volume of stool distally.
No bowel dilatation to suggest obstruction. Lower most lung bases
are clear. No acute osseous abnormalities are seen.
IMPRESSION: Unchanged 6 mm stone projects over the lower left renal shadow. No
radiographic evidence of right urolithiasis.

## 2023-12-28 ENCOUNTER — Telehealth: Payer: Self-pay | Admitting: Pulmonary Disease

## 2023-12-28 NOTE — Telephone Encounter (Signed)
 LVMTCB to schedule pulmonary consult.

## 2024-01-04 NOTE — Telephone Encounter (Signed)
 LVMTCB to schedule pulmonary consult.

## 2024-02-09 ENCOUNTER — Other Ambulatory Visit: Payer: Self-pay

## 2024-02-09 DIAGNOSIS — Z1231 Encounter for screening mammogram for malignant neoplasm of breast: Secondary | ICD-10-CM
